# Patient Record
Sex: Male | Born: 1960 | Race: White | Hispanic: No | Marital: Married | Smoking: Former smoker
Health system: Southern US, Community
[De-identification: ages and names within clinical notes are randomized; demographics above are authoritative.]

## PROBLEM LIST (undated history)

## (undated) DIAGNOSIS — G8929 Other chronic pain: Secondary | ICD-10-CM

## (undated) DIAGNOSIS — M542 Cervicalgia: Secondary | ICD-10-CM

## (undated) DIAGNOSIS — M549 Dorsalgia, unspecified: Secondary | ICD-10-CM

## (undated) DIAGNOSIS — B1920 Unspecified viral hepatitis C without hepatic coma: Secondary | ICD-10-CM

## (undated) DIAGNOSIS — Z972 Presence of dental prosthetic device (complete) (partial): Secondary | ICD-10-CM

## (undated) DIAGNOSIS — F513 Sleepwalking [somnambulism]: Secondary | ICD-10-CM

## (undated) DIAGNOSIS — I4891 Unspecified atrial fibrillation: Secondary | ICD-10-CM

## (undated) DIAGNOSIS — F1111 Opioid abuse, in remission: Secondary | ICD-10-CM

## (undated) DIAGNOSIS — Z87442 Personal history of urinary calculi: Secondary | ICD-10-CM

## (undated) DIAGNOSIS — F1910 Other psychoactive substance abuse, uncomplicated: Secondary | ICD-10-CM

## (undated) DIAGNOSIS — N200 Calculus of kidney: Secondary | ICD-10-CM

## (undated) DIAGNOSIS — I10 Essential (primary) hypertension: Secondary | ICD-10-CM

## (undated) DIAGNOSIS — K219 Gastro-esophageal reflux disease without esophagitis: Secondary | ICD-10-CM

## (undated) DIAGNOSIS — J45909 Unspecified asthma, uncomplicated: Secondary | ICD-10-CM

## (undated) DIAGNOSIS — F1190 Opioid use, unspecified, uncomplicated: Secondary | ICD-10-CM

## (undated) DIAGNOSIS — Z973 Presence of spectacles and contact lenses: Secondary | ICD-10-CM

## (undated) DIAGNOSIS — C61 Malignant neoplasm of prostate: Secondary | ICD-10-CM

## (undated) HISTORY — DX: Presence of spectacles and contact lenses: Z97.3

## (undated) HISTORY — DX: Dorsalgia, unspecified: M54.9

## (undated) HISTORY — DX: Gastro-esophageal reflux disease without esophagitis: K21.9

## (undated) HISTORY — DX: Other chronic pain: G89.29

## (undated) HISTORY — DX: Presence of dental prosthetic device (complete) (partial): Z97.2

## (undated) HISTORY — DX: Cervicalgia: M54.2

## (undated) HISTORY — DX: Malignant neoplasm of prostate: C61

## (undated) HISTORY — PX: OTHER SURGICAL HISTORY: SHX169

## (undated) HISTORY — DX: Calculus of kidney: N20.0

---

## 1997-10-21 HISTORY — PX: FINGER SURGERY: SHX640

## 1998-09-28 HISTORY — PX: FINGER SURGERY: SHX640

## 2006-10-21 DIAGNOSIS — B182 Chronic viral hepatitis C: Secondary | ICD-10-CM

## 2006-10-21 HISTORY — DX: Chronic viral hepatitis C: B18.2

## 2008-11-09 ENCOUNTER — Ambulatory Visit: Payer: Self-pay | Admitting: Diagnostic Radiology

## 2008-11-09 ENCOUNTER — Emergency Department (HOSPITAL_BASED_OUTPATIENT_CLINIC_OR_DEPARTMENT_OTHER): Admission: EM | Admit: 2008-11-09 | Discharge: 2008-11-09 | Payer: Self-pay | Admitting: Emergency Medicine

## 2009-08-03 ENCOUNTER — Emergency Department (HOSPITAL_BASED_OUTPATIENT_CLINIC_OR_DEPARTMENT_OTHER): Admission: EM | Admit: 2009-08-03 | Discharge: 2009-08-03 | Payer: Self-pay | Admitting: Emergency Medicine

## 2009-08-03 ENCOUNTER — Ambulatory Visit: Payer: Self-pay | Admitting: Diagnostic Radiology

## 2009-10-29 ENCOUNTER — Ambulatory Visit: Payer: Self-pay | Admitting: Diagnostic Radiology

## 2009-10-29 ENCOUNTER — Emergency Department (HOSPITAL_BASED_OUTPATIENT_CLINIC_OR_DEPARTMENT_OTHER): Admission: EM | Admit: 2009-10-29 | Discharge: 2009-10-29 | Payer: Self-pay | Admitting: Emergency Medicine

## 2010-08-30 ENCOUNTER — Emergency Department (HOSPITAL_BASED_OUTPATIENT_CLINIC_OR_DEPARTMENT_OTHER)
Admission: EM | Admit: 2010-08-30 | Discharge: 2010-08-30 | Payer: Self-pay | Source: Home / Self Care | Admitting: Emergency Medicine

## 2010-08-30 ENCOUNTER — Ambulatory Visit: Payer: Self-pay | Admitting: Diagnostic Radiology

## 2011-01-06 LAB — BASIC METABOLIC PANEL
Calcium: 9 mg/dL (ref 8.4–10.5)
GFR calc Af Amer: >60 mL/min (ref >60)
GFR calc non Af Amer: >60 mL/min (ref >60)
Potassium: 4.2 mEq/L (ref 3.5–5.1)
Sodium: 145 mEq/L (ref 135–145)

## 2011-01-06 LAB — DIFFERENTIAL
Basophils Absolute: 0.6 10*3/uL — ABNORMAL HIGH (ref 0.0–0.1)
Lymphocytes Relative: 19 % (ref 12–46)
Lymphs Abs: 2.7 10*3/uL (ref 0.7–4.0)
Monocytes Absolute: 1.4 10*3/uL — ABNORMAL HIGH (ref 0.1–1.0)
Monocytes Relative: 10 % (ref 3–12)
Neutro Abs: 9.5 10*3/uL — ABNORMAL HIGH (ref 1.7–7.7)

## 2011-01-06 LAB — POCT CARDIAC MARKERS
CKMB, poc: 1 ng/mL (ref 1.0–8.0)
Myoglobin, poc: 63 ng/mL (ref 12–200)
Myoglobin, poc: 98.4 ng/mL (ref 12–200)
Troponin i, poc: <0.05 ng/mL (ref 0.00–0.09)

## 2011-01-06 LAB — CBC
HCT: 45.2 % (ref 39.0–52.0)
Hemoglobin: 15.7 g/dL (ref 13.0–17.0)
RBC: 5.06 MIL/uL (ref 4.22–5.81)
RDW: 12.6 % (ref 11.5–15.5)
WBC: 14.4 10*3/uL — ABNORMAL HIGH (ref 4.0–10.5)

## 2011-01-24 LAB — URINALYSIS, ROUTINE W REFLEX MICROSCOPIC
Bilirubin Urine: NEGATIVE
Glucose, UA: NEGATIVE mg/dL
Hgb urine dipstick: NEGATIVE
Protein, ur: NEGATIVE mg/dL

## 2012-10-01 DIAGNOSIS — M159 Polyosteoarthritis, unspecified: Secondary | ICD-10-CM

## 2012-10-01 DIAGNOSIS — N200 Calculus of kidney: Secondary | ICD-10-CM

## 2012-10-01 DIAGNOSIS — M5412 Radiculopathy, cervical region: Secondary | ICD-10-CM | POA: Insufficient documentation

## 2012-10-01 HISTORY — DX: Radiculopathy, cervical region: M54.12

## 2012-10-01 HISTORY — DX: Polyosteoarthritis, unspecified: M15.9

## 2012-10-02 DIAGNOSIS — M79609 Pain in unspecified limb: Secondary | ICD-10-CM | POA: Insufficient documentation

## 2012-11-02 ENCOUNTER — Encounter: Payer: Self-pay | Admitting: Internal Medicine

## 2012-11-02 ENCOUNTER — Ambulatory Visit (INDEPENDENT_AMBULATORY_CARE_PROVIDER_SITE_OTHER): Payer: BC Managed Care – PPO | Admitting: Internal Medicine

## 2012-11-02 VITALS — BP 130/90 | HR 80 | Temp 97.9°F | Ht 73.0 in | Wt 162.0 lb

## 2012-11-02 DIAGNOSIS — G8929 Other chronic pain: Secondary | ICD-10-CM

## 2012-11-02 DIAGNOSIS — M25521 Pain in right elbow: Secondary | ICD-10-CM | POA: Insufficient documentation

## 2012-11-02 DIAGNOSIS — M549 Dorsalgia, unspecified: Secondary | ICD-10-CM

## 2012-11-02 DIAGNOSIS — M25529 Pain in unspecified elbow: Secondary | ICD-10-CM

## 2012-11-02 DIAGNOSIS — M542 Cervicalgia: Secondary | ICD-10-CM

## 2012-11-02 NOTE — Assessment & Plan Note (Signed)
Patient likely has traumatic tricep tendinitis.  Refer to orthopedic physician for possible steroid injection.
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
## 2012-11-03 ENCOUNTER — Encounter: Payer: Self-pay | Admitting: Medical

## 2012-11-03 ENCOUNTER — Ambulatory Visit (INDEPENDENT_AMBULATORY_CARE_PROVIDER_SITE_OTHER): Payer: BC Managed Care – PPO | Admitting: Medical

## 2012-11-03 VITALS — BP 120/80 | HR 80 | Temp 98.1°F | Resp 16 | Ht 69.0 in | Wt 163.0 lb

## 2012-11-03 DIAGNOSIS — M549 Dorsalgia, unspecified: Secondary | ICD-10-CM

## 2012-11-03 DIAGNOSIS — F17200 Nicotine dependence, unspecified, uncomplicated: Secondary | ICD-10-CM

## 2012-11-03 DIAGNOSIS — M5412 Radiculopathy, cervical region: Secondary | ICD-10-CM

## 2012-11-03 DIAGNOSIS — G8929 Other chronic pain: Secondary | ICD-10-CM

## 2012-11-03 DIAGNOSIS — M542 Cervicalgia: Secondary | ICD-10-CM

## 2012-11-03 DIAGNOSIS — M5410 Radiculopathy, site unspecified: Secondary | ICD-10-CM

## 2012-11-03 DIAGNOSIS — M25519 Pain in unspecified shoulder: Secondary | ICD-10-CM

## 2012-11-03 DIAGNOSIS — M79609 Pain in unspecified limb: Secondary | ICD-10-CM

## 2012-11-03 MED ORDER — GABAPENTIN 100 MG PO CAPS: ORAL_CAPSULE | ORAL | Status: DC
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
## 2012-11-03 NOTE — Patient Instructions (Signed)
Begin neurontin 100mg  1 capsule daily for 3 days, then twice daily for 3 days, then 3 times daily.    We will request prior records.  We will then look at a referral once we have records.

## 2012-11-04 ENCOUNTER — Telehealth: Payer: Self-pay | Admitting: Internal Medicine

## 2012-11-04 NOTE — Telephone Encounter (Signed)
Dismissal Letter sent by Certified Mail 11/04/2012  Received the Return Receipt showing the patient has the Dismissal Letter 11/06/2012

## 2013-01-15 ENCOUNTER — Ambulatory Visit (INDEPENDENT_AMBULATORY_CARE_PROVIDER_SITE_OTHER): Payer: No Typology Code available for payment source | Admitting: Family Medicine

## 2013-01-15 ENCOUNTER — Telehealth: Payer: Self-pay

## 2013-01-15 VITALS — BP 124/76 | HR 103 | Temp 98.3°F | Resp 17 | Ht 72.0 in | Wt 156.0 lb

## 2013-01-15 DIAGNOSIS — K219 Gastro-esophageal reflux disease without esophagitis: Secondary | ICD-10-CM

## 2013-01-15 DIAGNOSIS — M531 Cervicobrachial syndrome: Secondary | ICD-10-CM

## 2013-01-15 DIAGNOSIS — G542 Cervical root disorders, not elsewhere classified: Secondary | ICD-10-CM

## 2013-01-15 MED ORDER — GABAPENTIN 300 MG PO CAPS: 300.0000 mg | ORAL_CAPSULE | Freq: Every day | ORAL | Status: DC

## 2013-01-15 MED ORDER — OMEPRAZOLE 20 MG PO CPDR: 20.0000 mg | DELAYED_RELEASE_CAPSULE | Freq: Every day | ORAL | Status: DC

## 2013-01-15 MED ORDER — OXYMORPHONE HCL ER 15 MG PO TB12: 15.0000 mg | ORAL_TABLET | Freq: Two times a day (BID) | ORAL | Status: DC

## 2013-01-15 MED ORDER — CARISOPRODOL 350 MG PO TABS: 350.0000 mg | ORAL_TABLET | Freq: Two times a day (BID) | ORAL | Status: DC
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
## 2013-01-15 NOTE — Telephone Encounter (Signed)
Gave pt message from Dr L that he would like pt to RTC next Thurs and gave him hours. Pt agreed and wanted to thank Dr L, stating that what he had done allowed him to work today and really helped.   Pt reported that the co-pay for Opana is $380 and he can not afford it. He requested to be put back on the short acting Oxycodone hydrochloride 15 mg that has helped him in the past and will only cost him #15. Dr L, so you want to write this for pt?

## 2013-01-15 NOTE — Patient Instructions (Signed)
I am referring you to a physical therapist.  I cannot refill the narcotics.  I will also refer you to a pain specialist.

## 2013-01-19 NOTE — Telephone Encounter (Signed)
Please forward to the correct pool.

## 2013-01-19 NOTE — Telephone Encounter (Signed)
I really cannot write for chronic short acting narcotic medications.

## 2013-01-19 NOTE — Telephone Encounter (Signed)
Dr L, this message is still in my open encounters. Have you reviewed/addressed this yet?

## 2013-01-20 NOTE — Telephone Encounter (Signed)
Patient advised.

## 2013-01-20 NOTE — Telephone Encounter (Signed)
Called patient to advise left message for him to call me back so I can advise.

## 2013-01-21 ENCOUNTER — Ambulatory Visit (INDEPENDENT_AMBULATORY_CARE_PROVIDER_SITE_OTHER): Payer: No Typology Code available for payment source | Admitting: Family Medicine

## 2013-01-21 VITALS — BP 116/69 | HR 74 | Temp 98.1°F | Resp 16 | Ht 73.0 in | Wt 161.6 lb

## 2013-01-21 DIAGNOSIS — G8929 Other chronic pain: Secondary | ICD-10-CM

## 2013-01-21 DIAGNOSIS — M542 Cervicalgia: Secondary | ICD-10-CM

## 2013-01-21 MED ORDER — OXYCODONE HCL 15 MG PO TABS: 15.0000 mg | ORAL_TABLET | Freq: Two times a day (BID) | ORAL | Status: DC
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
## 2013-01-28 ENCOUNTER — Encounter: Payer: Self-pay | Admitting: Physical Medicine & Rehabilitation

## 2013-02-16 ENCOUNTER — Ambulatory Visit: Payer: BC Managed Care – PPO | Admitting: Physical Medicine & Rehabilitation

## 2013-02-16 ENCOUNTER — Ambulatory Visit (HOSPITAL_BASED_OUTPATIENT_CLINIC_OR_DEPARTMENT_OTHER): Payer: No Typology Code available for payment source | Admitting: Physical Medicine & Rehabilitation

## 2013-02-16 ENCOUNTER — Encounter: Payer: No Typology Code available for payment source | Attending: Physical Medicine & Rehabilitation

## 2013-02-16 ENCOUNTER — Telehealth: Payer: Self-pay

## 2013-02-16 ENCOUNTER — Telehealth: Payer: Self-pay | Admitting: Radiology

## 2013-02-16 ENCOUNTER — Encounter: Payer: Self-pay | Admitting: Physical Medicine & Rehabilitation

## 2013-02-16 VITALS — BP 124/80 | HR 83 | Resp 14 | Ht 73.0 in | Wt 160.0 lb

## 2013-02-16 DIAGNOSIS — G8929 Other chronic pain: Secondary | ICD-10-CM

## 2013-02-16 DIAGNOSIS — M5412 Radiculopathy, cervical region: Secondary | ICD-10-CM | POA: Insufficient documentation

## 2013-02-16 DIAGNOSIS — M25521 Pain in right elbow: Secondary | ICD-10-CM

## 2013-02-16 DIAGNOSIS — M4802 Spinal stenosis, cervical region: Secondary | ICD-10-CM | POA: Insufficient documentation

## 2013-02-16 DIAGNOSIS — M549 Dorsalgia, unspecified: Secondary | ICD-10-CM | POA: Insufficient documentation

## 2013-02-16 DIAGNOSIS — M25529 Pain in unspecified elbow: Secondary | ICD-10-CM | POA: Insufficient documentation

## 2013-02-16 DIAGNOSIS — Z5181 Encounter for therapeutic drug level monitoring: Secondary | ICD-10-CM

## 2013-02-16 DIAGNOSIS — Z79899 Other long term (current) drug therapy: Secondary | ICD-10-CM

## 2013-02-16 HISTORY — DX: Radiculopathy, cervical region: M54.12

## 2013-02-16 MED ORDER — DIAZEPAM 10 MG PO TABS: 10.0000 mg | ORAL_TABLET | Freq: Once | ORAL | Status: DC

## 2013-02-16 NOTE — Telephone Encounter (Signed)
Dr Wynn Banker... Does pt need to get a MRI? If he does there is not an order.. I saw in the comments Check out comments: Need MRI C spine [facility]   I called [facility] and they stated pt has not had a MRI C SPINE.Christian KitchenMarland Mitchell

## 2013-02-16 NOTE — Telephone Encounter (Signed)
Patient states he's had an MRI. He thinks he was at the [facility]. Please contact referring physician's office to see if they have further info on this

## 2013-02-16 NOTE — Telephone Encounter (Signed)
He also requests [address]

## 2013-02-16 NOTE — Addendum Note (Signed)
Addended by: Judd Gaudier on: 02/16/2013 01:28 PM   Modules accepted: Orders

## 2013-02-16 NOTE — Telephone Encounter (Signed)
Left patient a message that he needs to come back to the office to give urine sample in order to get medications.
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
## 2013-02-16 NOTE — Telephone Encounter (Signed)
These are both controlled substances.  Patient needs OV for hand written script, which we can do for only one month.  These need to be written by a Pain Clinic doctor per federal guidelines.  I will put in a referral to a pain clinic

## 2013-02-16 NOTE — Telephone Encounter (Signed)
Patient requests opana Rx please advise.

## 2013-02-16 NOTE — Patient Instructions (Signed)
Cervical epidural injection-

## 2013-02-17 ENCOUNTER — Ambulatory Visit (INDEPENDENT_AMBULATORY_CARE_PROVIDER_SITE_OTHER): Payer: No Typology Code available for payment source | Admitting: Family Medicine

## 2013-02-17 VITALS — BP 122/75 | HR 87 | Temp 98.4°F | Resp 16 | Ht 73.0 in | Wt 158.0 lb

## 2013-02-17 DIAGNOSIS — M531 Cervicobrachial syndrome: Secondary | ICD-10-CM

## 2013-02-17 DIAGNOSIS — G542 Cervical root disorders, not elsewhere classified: Secondary | ICD-10-CM

## 2013-02-17 DIAGNOSIS — M542 Cervicalgia: Secondary | ICD-10-CM

## 2013-02-17 DIAGNOSIS — G8929 Other chronic pain: Secondary | ICD-10-CM

## 2013-02-17 MED ORDER — OXYCODONE HCL 15 MG PO TABS: 15.0000 mg | ORAL_TABLET | Freq: Two times a day (BID) | ORAL | Status: DC

## 2013-02-17 MED ORDER — CARISOPRODOL 350 MG PO TABS: 350.0000 mg | ORAL_TABLET | Freq: Two times a day (BID) | ORAL | Status: DC

## 2013-02-17 MED ORDER — OXYMORPHONE HCL ER 15 MG PO TB12: 15.0000 mg | ORAL_TABLET | Freq: Two times a day (BID) | ORAL | Status: DC
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
## 2013-03-16 ENCOUNTER — Ambulatory Visit (INDEPENDENT_AMBULATORY_CARE_PROVIDER_SITE_OTHER): Payer: No Typology Code available for payment source | Admitting: Family Medicine

## 2013-03-16 VITALS — BP 135/77 | HR 81 | Temp 98.0°F | Resp 18 | Ht 72.0 in | Wt 150.0 lb

## 2013-03-16 DIAGNOSIS — G8929 Other chronic pain: Secondary | ICD-10-CM

## 2013-03-16 DIAGNOSIS — M542 Cervicalgia: Secondary | ICD-10-CM

## 2013-03-16 DIAGNOSIS — M531 Cervicobrachial syndrome: Secondary | ICD-10-CM

## 2013-03-16 DIAGNOSIS — G542 Cervical root disorders, not elsewhere classified: Secondary | ICD-10-CM

## 2013-03-16 MED ORDER — OXYCODONE HCL 15 MG PO TABS: 15.0000 mg | ORAL_TABLET | Freq: Three times a day (TID) | ORAL | Status: DC | PRN
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
## 2013-04-15 ENCOUNTER — Other Ambulatory Visit: Payer: Self-pay | Admitting: Family Medicine

## 2013-04-15 ENCOUNTER — Telehealth: Payer: Self-pay

## 2013-04-15 DIAGNOSIS — M542 Cervicalgia: Secondary | ICD-10-CM

## 2013-04-15 NOTE — Telephone Encounter (Signed)
PT WOULD LIKE TO TALK WITH YOU ABOUT GETTING A REFERRAL TO [facility] - DR Murray Hodgkins PLEASE CALL PT TO DISCUSS

## 2013-04-16 ENCOUNTER — Telehealth: Payer: Self-pay

## 2013-04-16 NOTE — Telephone Encounter (Signed)
PATIENT WOULD LIKE TO KNOW IF HE CAN GET MORE OF HIS PAIN MEDICATION UNTIL HE CAN GET TO HIS REFERRAL APPOINTMENT PLEASE CALL PATIENT AT [phone]

## 2013-04-16 NOTE — Telephone Encounter (Signed)
Called him to advise.  

## 2013-04-16 NOTE — Telephone Encounter (Signed)
no

## 2013-05-03 ENCOUNTER — Encounter (HOSPITAL_COMMUNITY): Payer: Self-pay | Admitting: Emergency Medicine

## 2013-05-03 ENCOUNTER — Emergency Department (HOSPITAL_COMMUNITY)
Admission: EM | Admit: 2013-05-03 | Discharge: 2013-05-03 | Disposition: A | Discharge status: Home / Self Care | Payer: No Typology Code available for payment source | Attending: Emergency Medicine | Admitting: Emergency Medicine

## 2013-05-03 DIAGNOSIS — K219 Gastro-esophageal reflux disease without esophagitis: Secondary | ICD-10-CM | POA: Insufficient documentation

## 2013-05-03 DIAGNOSIS — Z87442 Personal history of urinary calculi: Secondary | ICD-10-CM | POA: Insufficient documentation

## 2013-05-03 DIAGNOSIS — R109 Unspecified abdominal pain: Secondary | ICD-10-CM

## 2013-05-03 DIAGNOSIS — I10 Essential (primary) hypertension: Secondary | ICD-10-CM | POA: Insufficient documentation

## 2013-05-03 DIAGNOSIS — R079 Chest pain, unspecified: Secondary | ICD-10-CM | POA: Insufficient documentation

## 2013-05-03 DIAGNOSIS — Z98811 Dental restoration status: Secondary | ICD-10-CM | POA: Insufficient documentation

## 2013-05-03 DIAGNOSIS — IMO0001 Reserved for inherently not codable concepts without codable children: Secondary | ICD-10-CM | POA: Insufficient documentation

## 2013-05-03 DIAGNOSIS — F17200 Nicotine dependence, unspecified, uncomplicated: Secondary | ICD-10-CM | POA: Insufficient documentation

## 2013-05-03 DIAGNOSIS — R0602 Shortness of breath: Secondary | ICD-10-CM | POA: Insufficient documentation

## 2013-05-03 DIAGNOSIS — T50905A Adverse effect of unspecified drugs, medicaments and biological substances, initial encounter: Secondary | ICD-10-CM

## 2013-05-03 DIAGNOSIS — R1084 Generalized abdominal pain: Secondary | ICD-10-CM | POA: Insufficient documentation

## 2013-05-03 DIAGNOSIS — Z789 Other specified health status: Secondary | ICD-10-CM | POA: Insufficient documentation

## 2013-05-03 DIAGNOSIS — G8929 Other chronic pain: Secondary | ICD-10-CM | POA: Insufficient documentation

## 2013-05-03 DIAGNOSIS — Z79899 Other long term (current) drug therapy: Secondary | ICD-10-CM | POA: Insufficient documentation

## 2013-05-03 DIAGNOSIS — R112 Nausea with vomiting, unspecified: Secondary | ICD-10-CM | POA: Insufficient documentation

## 2013-05-03 DIAGNOSIS — R197 Diarrhea, unspecified: Secondary | ICD-10-CM | POA: Insufficient documentation

## 2013-05-03 DIAGNOSIS — R6883 Chills (without fever): Secondary | ICD-10-CM | POA: Insufficient documentation

## 2013-05-03 LAB — URINALYSIS, ROUTINE W REFLEX MICROSCOPIC
Glucose, UA: NEGATIVE mg/dL
Hgb urine dipstick: NEGATIVE
Protein, ur: 100 mg/dL — AB
Specific Gravity, Urine: 1.037 — ABNORMAL HIGH (ref 1.005–1.030)
Urobilinogen, UA: 1 mg/dL (ref 0.0–1.0)

## 2013-05-03 LAB — CBC WITH DIFFERENTIAL/PLATELET
Basophils Absolute: 0 10*3/uL (ref 0.0–0.1)
Eosinophils Absolute: 0.2 10*3/uL (ref 0.0–0.7)
Lymphs Abs: 3.3 10*3/uL (ref 0.7–4.0)
MCH: 31.1 pg (ref 26.0–34.0)
MCHC: 36.2 g/dL — ABNORMAL HIGH (ref 30.0–36.0)
MCV: 86 fL (ref 78.0–100.0)
Monocytes Absolute: 2.1 10*3/uL — ABNORMAL HIGH (ref 0.1–1.0)
Neutro Abs: 10.9 10*3/uL — ABNORMAL HIGH (ref 1.7–7.7)
Platelets: 262 10*3/uL (ref 150–400)
RDW: 13.4 % (ref 11.5–15.5)
WBC: 16.5 10*3/uL — ABNORMAL HIGH (ref 4.0–10.5)

## 2013-05-03 LAB — LIPASE, BLOOD: Lipase: 56 U/L (ref 11–59)

## 2013-05-03 LAB — COMPREHENSIVE METABOLIC PANEL
AST: 29 U/L (ref 0–37)
Albumin: 5.7 g/dL — ABNORMAL HIGH (ref 3.5–5.2)
BUN: 14 mg/dL (ref 6–23)
Calcium: 12.3 mg/dL — ABNORMAL HIGH (ref 8.4–10.5)
Creatinine, Ser: 1.35 mg/dL (ref 0.50–1.35)

## 2013-05-03 LAB — RAPID URINE DRUG SCREEN, HOSP PERFORMED
Amphetamines: POSITIVE — AB
Cocaine: POSITIVE — AB
Opiates: POSITIVE — AB
Tetrahydrocannabinol: NOT DETECTED

## 2013-05-03 LAB — CK: Total CK: 102 U/L (ref 7–232)

## 2013-05-03 LAB — URINE MICROSCOPIC-ADD ON

## 2013-05-03 MED ORDER — ONDANSETRON HCL 4 MG/2ML IJ SOLN
4.0000 mg | Freq: Once | INTRAMUSCULAR | Status: AC
  Administered 2013-05-03: 4 mg via INTRAVENOUS
  Filled 2013-05-03: qty 2

## 2013-05-03 MED ORDER — OXYCODONE-ACETAMINOPHEN 5-325 MG PO TABS: 1.0000 | ORAL_TABLET | ORAL | Status: DC | PRN

## 2013-05-03 MED ORDER — ONDANSETRON HCL 4 MG PO TABS: 4.0000 mg | ORAL_TABLET | Freq: Three times a day (TID) | ORAL | Status: DC | PRN

## 2013-05-03 MED ORDER — MORPHINE SULFATE 4 MG/ML IJ SOLN
4.0000 mg | Freq: Once | INTRAMUSCULAR | Status: AC
  Administered 2013-05-03: 4 mg via INTRAVENOUS
  Filled 2013-05-03: qty 1

## 2013-05-03 MED ORDER — PROMETHAZINE HCL 25 MG/ML IJ SOLN
25.0000 mg | Freq: Once | INTRAMUSCULAR | Status: AC
  Administered 2013-05-03: 25 mg via INTRAVENOUS
  Filled 2013-05-03: qty 1

## 2013-05-03 MED ORDER — MORPHINE SULFATE 4 MG/ML IJ SOLN
6.0000 mg | Freq: Once | INTRAMUSCULAR | Status: AC
  Administered 2013-05-03: 6 mg via INTRAVENOUS
  Filled 2013-05-03: qty 2

## 2013-05-03 MED ORDER — PANTOPRAZOLE SODIUM 40 MG IV SOLR
40.0000 mg | Freq: Once | INTRAVENOUS | Status: AC
Start: 2013-05-03 — End: 2013-05-03
  Administered 2013-05-03: 40 mg via INTRAVENOUS
  Filled 2013-05-03: qty 40

## 2013-05-03 MED ORDER — SODIUM CHLORIDE 0.9 % IV BOLUS (SEPSIS)
1000.0000 mL | Freq: Once | INTRAVENOUS | Status: AC
  Administered 2013-05-03: 1000 mL via INTRAVENOUS

## 2013-05-03 MED ORDER — SODIUM CHLORIDE 0.9 % IV SOLN: Freq: Once | INTRAVENOUS | Status: DC

## 2013-05-03 MED ORDER — GI COCKTAIL ~~LOC~~
30.0000 mL | Freq: Once | ORAL | Status: AC
  Administered 2013-05-03: 30 mL via ORAL
  Filled 2013-05-03: qty 30

## 2013-05-03 NOTE — ED Provider Notes (Signed)
History    CSN: [id number] Arrival date & time 05/03/13  4098  First MD Initiated Contact with Patient 05/03/13 1012     Chief Complaint  Patient presents with  . Abdominal Pain   (Consider location/radiation/quality/duration/timing/severity/associated sxs/prior Treatment) HPI Comments: Patient reports he was drinking tequila and smoked a joint on a boat with friends four days ago and became sick, first vomiting off the side of the boat, then continuing to have N/V/D and abdominal pain the rest of the weekend.  States he is no longer able to keep any fluids down.  Does note bloody emesis twice, once overnight Friday and once Saturday night.  He has since not seen any blood in his emesis.  Diarrhea is described as dark.  States "I am dehydrated as hell" and "thirsty as hell."  States he has pain in his chest that is burning, worse with drinking.  Pain in his abdomen is also initiated by drinking or eating, described as "needles."  States he is so dehydrated his muscles are cramping and he has pain all over.  States everyone else he was with on the boat is also sick with similar symptoms.  Pt also is on chronic narcotics for chronic neck pain, has been weaning himself off of his narcotics because of problems with affording medications/insurance/supply (see [facility] recently).  Also notes he is SOB today.  Denies fevers, cough.   Patient is a 52 y.o. male presenting with abdominal pain. The history is provided by the patient.  Abdominal Pain Associated symptoms include abdominal pain, chest pain, chills, myalgias, nausea and vomiting. Pertinent negatives include no coughing or fever.   Past Medical History  Diagnosis Date  . GERD (gastroesophageal reflux disease)   . Chronic neck pain   . Chronic back pain   . Kidney stones   . Wears glasses   . Wears partial dentures    Past Surgical History  Procedure Laterality Date  . Finger surgery  1999    reattachement, left  1st - 4th  . Minor laceration repair     Family History  Problem Relation Age of Onset  . Arthritis Mother   . Diabetes Mother   . Cancer Father     prostate  . Arthritis Maternal Grandmother   . Heart disease Maternal Grandmother   . Arthritis Paternal Grandmother   . Stroke Paternal Grandmother   . Diabetes Paternal Grandmother   . Cancer Paternal Grandfather     colon  . Hyperlipidemia Paternal Grandfather   . Hypertension Paternal Grandfather    History  Substance Use Topics  . Smoking status: Current Every Day Smoker -- 1.00 packs/day for 25 years    Types: Cigarettes  . Smokeless tobacco: Not on file  . Alcohol Use: No    Review of Systems  Constitutional: Positive for chills. Negative for fever.  Respiratory: Positive for shortness of breath. Negative for cough.   Cardiovascular: Positive for chest pain.  Gastrointestinal: Positive for nausea, vomiting, abdominal pain and diarrhea.  Genitourinary: Positive for decreased urine volume. Negative for dysuria, urgency and frequency.  Musculoskeletal: Positive for myalgias.    Allergies  Review of patient's allergies indicates no known allergies.  Home Medications   Current Outpatient Rx  Name  Route  Sig  Dispense  Refill  . carisoprodol (SOMA) 350 MG tablet   Oral   Take 1 tablet (350 mg total) by mouth 2 (two) times daily.   60 tablet   2   .  diazepam (VALIUM) 10 MG tablet   Oral   Take 1 tablet (10 mg total) by mouth once.   2 tablet   1   . gabapentin (NEURONTIN) 300 MG capsule   Oral   Take 1 capsule (300 mg total) by mouth at bedtime.   90 capsule   3   . omeprazole (PRILOSEC) 20 MG capsule   Oral   Take 1 capsule (20 mg total) by mouth daily.   30 capsule   6   . oxyCODONE (ROXICODONE) 15 MG immediate release tablet   Oral   Take 1 tablet (15 mg total) by mouth every 8 (eight) hours as needed for pain.   90 tablet   0   . Tamsulosin HCl (FLOMAX PO)   Oral   Take by mouth.           BP 160/142  Pulse 118  Temp(Src) 98.7 F (37.1 C) (Oral)  Resp 16  Ht 6\' 1"  (1.854 m)  Wt 145 lb (65.772 kg)  BMI 19.13 kg/m2  SpO2 95% Physical Exam  Nursing note and vitals reviewed. Constitutional: He appears well-developed and well-nourished. No distress.  HENT:  Head: Normocephalic and atraumatic.  Neck: Neck supple.  Cardiovascular: Normal rate and regular rhythm.   Pulmonary/Chest: Effort normal and breath sounds normal. No respiratory distress. He has no wheezes. He has no rales.  Abdominal: Soft. He exhibits no distension and no mass. There is generalized tenderness. There is no rebound and no guarding.  Musculoskeletal: He exhibits no edema.  Neurological: He is alert. He exhibits normal muscle tone.  Skin: He is not diaphoretic.  Psychiatric: His affect is angry. He is agitated.    ED Course  Procedures (including critical care time) Labs Reviewed  CBC WITH DIFFERENTIAL - Abnormal; Notable for the following:    WBC 16.5 (*)    RBC 6.30 (*)    Hemoglobin 19.6 (*)    HCT 54.2 (*)    MCHC 36.2 (*)    Monocytes Relative 13 (*)    Neutro Abs 10.9 (*)    Monocytes Absolute 2.1 (*)    All other components within normal limits  COMPREHENSIVE METABOLIC PANEL - Abnormal; Notable for the following:    Chloride 95 (*)    CO2 33 (*)    Glucose, Bld 113 (*)    Calcium 12.3 (*)    Total Protein 9.4 (*)    Albumin 5.7 (*)    Total Bilirubin 2.1 (*)    GFR calc non Af Amer 60 (*)    GFR calc Af Amer 69 (*)    All other components within normal limits  URINE RAPID DRUG SCREEN (HOSP PERFORMED) - Abnormal; Notable for the following:    Opiates POSITIVE (*)    Cocaine POSITIVE (*)    Benzodiazepines POSITIVE (*)    Amphetamines POSITIVE (*)    All other components within normal limits  URINALYSIS, ROUTINE W REFLEX MICROSCOPIC - Abnormal; Notable for the following:    Color, Urine ORANGE (*)    APPearance CLOUDY (*)    Specific Gravity, Urine 1.037 (*)    Bilirubin  Urine SMALL (*)    Ketones, ur 40 (*)    Protein, ur 100 (*)    Nitrite POSITIVE (*)    Leukocytes, UA SMALL (*)    All other components within normal limits  URINE MICROSCOPIC-ADD ON - Abnormal; Notable for the following:    Casts HYALINE CASTS (*)    All other components  within normal limits  LIPASE, BLOOD  CK   No results found.  Reviewed labs with Dr Juleen China.   2:49 PM Pt feeling much better after 2L IVF.  Pt looks much improved.    Filed Vitals:   05/03/13 1505  BP: 105/64  Pulse: 75  Temp: 97.8 F (36.6 C)  Resp: 18     1. Abdominal pain   2. Nausea vomiting and diarrhea   3. Adverse reaction to drug, initial encounter     MDM  Pt with abdominal pain, N/V/D, and body aches for several days following smoking unknown substance.  All others with him that developed same symptoms.  Labs show dehydration. Pt given IVF, symptomatic treatment in ED.  Feeling much better after treatment. Discussed all results with patient.  Pt given return precautions.  Pt verbalizes understanding and agrees with plan.      Trixie Dredge, PA-C 05/03/13 1902

## 2013-05-03 NOTE — ED Notes (Signed)
Per pt, abdominal pain, N/V/D since Fri-unable to tolerate PO's

## 2013-05-03 NOTE — Discharge Instructions (Signed)
Read the information below.  Use the prescribed medication as directed.  Please discuss all new medications with your pharmacist.  Do not take additional tylenol while taking the prescribed pain medication to avoid overdose.  Your drug screen was positive for cocaine, amphetamines, benzodiazepines, and narcotics. Drink plenty of fluids over the next few days. You may return to the Emergency Department at any time for worsening condition or any new symptoms that concern you.  If you develop high fevers, worsening abdominal pain, uncontrolled vomiting, or are unable to tolerate fluids by mouth, return to the ER for a recheck.    Abdominal Pain Abdominal pain can be caused by many things. Your caregiver decides the seriousness of your pain by an examination and possibly blood tests and X-rays. Many cases can be observed and treated at home. Most abdominal pain is not caused by a disease and will probably improve without treatment. However, in many cases, more time must pass before a clear cause of the pain can be found. Before that point, it may not be known if you need more testing, or if hospitalization or surgery is needed. HOME CARE INSTRUCTIONS   Do not take laxatives unless directed by your caregiver.  Take pain medicine only as directed by your caregiver.  Only take over-the-counter or prescription medicines for pain, discomfort, or fever as directed by your caregiver.  Try a clear liquid diet (broth, tea, or water) for as long as directed by your caregiver. Slowly move to a bland diet as tolerated. SEEK IMMEDIATE MEDICAL CARE IF:   The pain does not go away.  You have a fever.  You keep throwing up (vomiting).  The pain is felt only in portions of the abdomen. Pain in the right side could possibly be appendicitis. In an adult, pain in the left lower portion of the abdomen could be colitis or diverticulitis.  You pass bloody or black tarry stools. MAKE SURE YOU:   Understand these  instructions.  Will watch your condition.  Will get help right away if you are not doing well or get worse. Document Released: 07/17/2005 Document Revised: 12/30/2011 Document Reviewed: 05/25/2008 [facility] Patient Information 2014 [place], [place].  Nausea and Vomiting Nausea means you feel sick to your stomach. Throwing up (vomiting) is a reflex where stomach contents come out of your mouth. HOME CARE   Take medicine as told by your doctor.  Do not force yourself to eat. However, you do need to drink fluids.  If you feel like eating, eat a normal diet as told by your doctor.  Eat rice, wheat, potatoes, bread, lean meats, yogurt, fruits, and vegetables.  Avoid high-fat foods.  Drink enough fluids to keep your pee (urine) clear or pale yellow.  Ask your doctor how to replace body fluid losses (rehydrate). Signs of body fluid loss (dehydration) include:  Feeling very thirsty.  Dry lips and mouth.  Feeling dizzy.  Dark pee.  Peeing less than normal.  Feeling confused.  Fast breathing or heart rate. GET HELP RIGHT AWAY IF:   You have blood in your throw up.  You have black or bloody poop (stool).  You have a bad headache or stiff neck.  You feel confused.  You have bad belly (abdominal) pain.  You have chest pain or trouble breathing.  You do not pee at least once every 8 hours.  You have cold, clammy skin.  You keep throwing up after 24 to 48 hours.  You have a fever. MAKE SURE  YOU:   Understand these instructions.  Will watch your condition.  Will get help right away if you are not doing well or get worse. Document Released: 03/25/2008 Document Revised: 12/30/2011 Document Reviewed: 03/08/2011 [facility] Patient Information 2014 [place], [place].   RESOURCE GUIDE  Chronic Pain Problems: Contact [facility]  [phone] Patients need to be referred by their primary care doctor.  Insufficient Money for Medicine: Contact [employer]  Way:  call "211."   No Primary Care Doctor: - Call Health Connect  [phone] - can help you locate a primary care doctor that  accepts your insurance, provides certain services, etc. - Physician Referral Service- [phone]  Agencies that provide inexpensive medical care: - [facility]  [phone] - [facility]  [phone] - [facility]  [phone] - Women's Clinic  [phone] - Planned Parenthood  [phone] [facility]  [phone]  Medicaid-accepting [facility] Providers: - [facility]- [address]  [phone], Mon-Fri 9am-7pm, Sat 9am-1pm - [facility]- [address] [place], [address] [place]  [phone] - [facility]- [address] [place]  [phone] [facility]- [address]  [phone] - Renaye Rakers- [address] [place], [address], [phone]  Only accepts [place] patients after they have their name  applied to their card  Self Pay (no insurance) in [place]: - Sickle Cell Patients - [facility]  [address] [place], [phone] - [facility]- [address] [place]  [phone]       [facility]- [address]       -     [facility]- see information above (Speak to [employer] if you do not have insurance)       -  [facility]- [address],  [phone]       -  [facility]- [address] [phone]       -  Dr Julio Sicks-  [address], [place], [phone]       -  [facility] - [address] [phone]       -  [facility]- [address], [phone], also [address], [phone]       -     [facility]- [address] [place], [phone], 1st & 3rd Saturday         every month, 10am-1pm  -     Community [facility] and [facility]   [address] [place], [place].   Phone:  [phone], Fax:  [phone]. Hours of Operation:  9 am - 6 pm, M-F.  -     [facility]   [address], [place]   Phone: [phone], Fax: [phone]. Hours of Operation:  8:30 am - 5:30 pm, M-F.  [facility] [address] [place], [place] [address] [phone]  [facility] [address] [place], [place] [address] [phone]  1) Find a Doctor and Pay Out of Pocket Although you won't have to find out who is covered by your insurance plan, it is a good idea to ask around and get recommendations. You will then need to call the office and see  if the doctor you have chosen will accept you as a new patient and what types of options they offer for patients who are self-pay. Some doctors offer discounts or will set up payment plans for their patients who do not have insurance, but you will need to ask so you aren't surprised when you get to your appointment.  2) Contact Your Local Health Department Not all health departments have doctors that can see patients for sick visits, but many do, so it is worth a call to see if yours does. If you don't know where your local health department is, you can check in your phone book. The CDC also has a tool to help you locate your state's health department, and many state websites also have listings of all of their local health departments.  3) Find a Walk-in Clinic If your illness is not likely to be very severe or complicated, you may want to try a walk in clinic. These are popping up all over the country in pharmacies, drugstores, and shopping centers. They're usually staffed by nurse practitioners or physician assistants that have been trained to treat common illnesses and complaints. They're usually fairly quick and inexpensive. However, if you have serious medical issues or chronic medical problems, these are probably not your best option  STD  Testing - [facility] Department of [facility] [place], STD Clinic, [address], [place], phone [phone] or [phone].  Monday - Friday, call for an appointment. [facility] Department of [employer], STD Clinic, [address], [place], phone [phone] or [phone].  Monday - Friday, call for an appointment.  Abuse/Neglect: [facility] Child Abuse Hotline 8507150171 [facility] Child Abuse Hotline ([phone] (After Hours)  Emergency Shelter:  [facility] [phone]  Maternity Homes: - Room at the [place] [facility] [phone] - [facility] [phone]  MRSA Hotline #:   930 865 3953  Dental Assistance If unable to pay or uninsured, contact:  [facility] to become qualified for the adult dental clinic.  Patients with Medicaid: [facility] [phone] [address], [phone] [address] [phone]  If unable to pay, or uninsured, contact [facility] [phone] in [place], [phone] in [facility]) to become qualified for the adult dental clinic  [facility] [address] [place], [place] [address] [phone] [email or web address]  Other Proofreader Services: - [facility]- [address] [place], [place], [place], [address], [phone], Ext. 123, 2nd and 4th Thursday of the month at 6:30am.  10 clients each day by appointment, can sometimes see walk-in patients if someone does not show for an appointment. [facility]- [address] [place], [place], [address], [phone] - [facility] [address] [place], [place], [address], [phone] - [place] [facility]- [phone] [facility] Health Department- [phone] [facility] Health [phone]       Behavioral Health Resources in the [facility]  Intensive Outpatient  Programs: [facility]      [address] [place], [place] [phone] Both a day and evening program       [facility] Outpatient     [address]        [place], [place] [address] [phone]         ADS: [facility] [address] [place] [phone]  [facility] ACCESS LINE: 3406144725 or [phone] [address] [place], [place] [address] [email or web address]   Substance Abuse Resources: - [facility] Drug [facility]  [phone] - [facility] [phone] - [facility] [place] [phone] Floydene Flock [phone] - Residential & Outpatient Substance Abuse Program  [phone]  Psychological Services: [facility]  ([phone] [facility]  [phone] - [facility], [phone] [place]. [address] [place], ACCESS LINE: 949-345-4484 or [phone], [email or web address]  Mobile Crisis Teams:                                        [facility]atives         Mobile Crisis Care Unit [phone]             [facility] [address] Ginette Otto [phone]                                         Interventionist [address] [place] [phone]  Self-Help/Support Groups: Mental Health Assoc. of [employer] of support groups 812-217-9395 (call for more info)  Narcotics Anonymous (NA) [facility] [address] [place] - 2 meetings at this location  Residential Treatment Programs:  [facility]      [address]        [place]       [phone]         [facility] [address], [place] [address] [place], [place]  [address] [phone]  [facility]  [address] [place], [place] [address] [phone] Admissions: 8am-3pm  M-F  [facility]     [address]        [place], [place] [address]       [phone]         [facility] [address] [place], [place] [phone]  [facility] [address] [place], [place] [phone]  [facility] - Intensive Outpatient      [address]     [place], [place]       [phone]         [facility] ([facility]     [address] [place], [place] [phone] or [phone]  Residential Treatment Services (RTS), Medicaid [address] [place], [place] [phone]  [facility] [address]                                               [address] [place] [phone]  [facility] Resources: [phone]               General Therapy                                                Angie Fava, PhD        [phone]  [place], [place] [address]         [phone]   Insurance  [facility] Behavioral   [address] [place], [place] [address] [phone]  [facility] Recovery [address] [place], [place] [address] [phone] Insurance/Medicaid/sponsorship through [facility] Families                                              [address]                                        [place], [place] [address]    Therapy/tele-psych/case         [phone]          [facility] [address] [place], [place]  [address]  Adolescent/group home/case management [phone]                                           Creola Corn PhD       General therapy       Insurance   [phone]         Dr. Lolly Mustache, Insurance, M-F [phone]  [facility] [place]  [employer] [facility] Dept. [address]                 [address]         [address] [place] [address]  Blondell Reveal Phone:  [phone]                                  Phone:  [phone]                   Phone:  [phone]  [facility], [phone] - [facility] - [facility]- [phone]       -     [facility] in [place], [address]             [phone], Insurance  [place] Child Abuse Hotline [phone] or 4370781642 (After Hours)

## 2013-05-05 NOTE — ED Provider Notes (Signed)
Medical screening examination/treatment/procedure(s) were performed by non-physician practitioner and as supervising physician I was immediately available for consultation/collaboration.  Buna Cuppett, MD 05/05/13 1455 

## 2013-05-11 ENCOUNTER — Ambulatory Visit (INDEPENDENT_AMBULATORY_CARE_PROVIDER_SITE_OTHER): Payer: No Typology Code available for payment source | Admitting: Emergency Medicine

## 2013-05-11 VITALS — BP 118/80 | HR 90 | Temp 98.3°F | Resp 16 | Ht 71.0 in | Wt 155.0 lb

## 2013-05-11 DIAGNOSIS — G542 Cervical root disorders, not elsewhere classified: Secondary | ICD-10-CM

## 2013-05-11 DIAGNOSIS — M542 Cervicalgia: Secondary | ICD-10-CM

## 2013-05-11 DIAGNOSIS — M488X2 Other specified spondylopathies, cervical region: Secondary | ICD-10-CM

## 2013-05-11 DIAGNOSIS — G8929 Other chronic pain: Secondary | ICD-10-CM

## 2013-05-11 DIAGNOSIS — M5410 Radiculopathy, site unspecified: Secondary | ICD-10-CM

## 2013-05-11 MED ORDER — OXYCODONE HCL 15 MG PO TABS: 15.0000 mg | ORAL_TABLET | Freq: Three times a day (TID) | ORAL | Status: DC | PRN

## 2013-05-11 MED ORDER — CYCLOBENZAPRINE HCL 10 MG PO TABS: 10.0000 mg | ORAL_TABLET | Freq: Three times a day (TID) | ORAL | Status: DC | PRN

## 2013-05-11 NOTE — Patient Instructions (Addendum)
Cervical Radiculopathy  Cervical radiculopathy happens when a nerve in the neck is pinched or bruised by a slipped (herniated) disk or by arthritic changes in the bones of the cervical spine. This can occur due to an injury or as part of the normal aging process. Pressure on the cervical nerves can cause pain or numbness that runs from your neck all the way down into your arm and fingers.  CAUSES   There are many possible causes, including:   Injury.   Muscle tightness in the neck from overuse.   Swollen, painful joints (arthritis).   Breakdown or degeneration in the bones and joints of the spine (spondylosis) due to aging.   Bone spurs that may develop near the cervical nerves.  SYMPTOMS   Symptoms include pain, weakness, or numbness in the affected arm and hand. Pain can be severe or irritating. Symptoms may be worse when extending or turning the neck.  DIAGNOSIS   Your caregiver will ask about your symptoms and do a physical exam. He or she may test your strength and reflexes. X-rays, CT scans, and MRI scans may be needed in cases of injury or if the symptoms do not go away after a period of time. Electromyography (EMG) or nerve conduction testing may be done to study how your nerves and muscles are working.  TREATMENT   Your caregiver may recommend certain exercises to help relieve your symptoms. Cervical radiculopathy can, and often does, get better with time and treatment. If your problems continue, treatment options may include:   Wearing a soft collar for short periods of time.   Physical therapy to strengthen the neck muscles.   Medicines, such as nonsteroidal anti-inflammatory drugs (NSAIDs), oral corticosteroids, or spinal injections.   Surgery. Different types of surgery may be done depending on the cause of your problems.  HOME CARE INSTRUCTIONS    Put ice on the affected area.   Put ice in a plastic bag.   Place a towel between your skin and the bag.    Leave the ice on for 15-20 minutes, 3-4 times a day or as directed by your caregiver.   If ice does not help, you can try using heat. Take a warm shower or bath, or use a hot water bottle as directed by your caregiver.   You may try a gentle neck and shoulder massage.   Use a flat pillow when you sleep.   Only take over-the-counter or prescription medicines for pain, discomfort, or fever as directed by your caregiver.   If physical therapy was prescribed, follow your caregiver's directions.   If a soft collar was prescribed, use it as directed.  SEEK IMMEDIATE MEDICAL CARE IF:    Your pain gets much worse and cannot be controlled with medicines.   You have weakness or numbness in your hand, arm, face, or leg.   You have a high fever or a stiff, rigid neck.   You lose bowel or bladder control (incontinence).   You have trouble with walking, balance, or speaking.  MAKE SURE YOU:    Understand these instructions.   Will watch your condition.   Will get help right away if you are not doing well or get worse.  Document Released: 07/02/2001 Document Revised: 12/30/2011 Document Reviewed: 05/21/2011  [employer] Patient Information 2014 [employer].

## 2013-05-11 NOTE — Progress Notes (Signed)
[facility] [address], [place] [address] [phone]  Date:  05/11/2013   Name:  Christian Mitchell   DOB:  1961/10/13   MRN:  [id number]  PCP:  Elvina Sidle, MD    Chief Complaint: Medication Refill   History of Present Illness:  Christian Mitchell is a 52 y.o. very pleasant male patient who presents with the following:  Long history of neck pain that radiates into the left arm in a C6-7 distribution.  Is in the process of a referral to neurosurgery but has run out of medication in the meantime.  No improvement with over the counter medications or other home remedies. Denies other complaint or health concern today. Old medical records reviewed in detail.  Patient Active Problem List   Diagnosis Date Noted  . Cervical radiculitis 02/16/2013  . Chronic neck pain 11/02/2012  . Right elbow pain 11/02/2012    Past Medical History  Diagnosis Date  . GERD (gastroesophageal reflux disease)   . Chronic neck pain   . Chronic back pain   . Kidney stones   . Wears glasses   . Wears partial dentures     Past Surgical History  Procedure Laterality Date  . Finger surgery  1999    reattachement, left 1st - 4th  . Minor laceration repair      History  Substance Use Topics  . Smoking status: Current Every Day Smoker -- 1.00 packs/day for 25 years    Types: Cigarettes  . Smokeless tobacco: Not on file  . Alcohol Use: No    Family History  Problem Relation Age of Onset  . Arthritis Mother   . Diabetes Mother   . Cancer Father     prostate  . Arthritis Maternal Grandmother   . Heart disease Maternal Grandmother   . Arthritis Paternal Grandmother   . Stroke Paternal Grandmother   . Diabetes Paternal Grandmother   . Cancer Paternal Grandfather     colon  . Hyperlipidemia Paternal Grandfather   . Hypertension Paternal Grandfather     No Known Allergies  Medication list has been reviewed and updated.  Current Outpatient Prescriptions on  File Prior to Visit  Medication Sig Dispense Refill  . carisoprodol (SOMA) 350 MG tablet Take 1 tablet (350 mg total) by mouth 2 (two) times daily.  60 tablet  2  . gabapentin (NEURONTIN) 300 MG capsule Take 1 capsule (300 mg total) by mouth at bedtime.  90 capsule  3  . omeprazole (PRILOSEC) 20 MG capsule Take 1 capsule (20 mg total) by mouth daily.  30 capsule  6  . ondansetron (ZOFRAN) 4 MG tablet Take 1 tablet (4 mg total) by mouth every 8 (eight) hours as needed for nausea.  15 tablet  0  . oxyCODONE (ROXICODONE) 15 MG immediate release tablet Take 1 tablet (15 mg total) by mouth every 8 (eight) hours as needed for pain.  90 tablet  0  . Tamsulosin HCl (FLOMAX PO) Take by mouth.       No current facility-administered medications on file prior to visit.    Review of Systems:  As per HPI, otherwise negative.    Physical Examination: Filed Vitals:   05/11/13 1815  BP: 118/80  Pulse: 90  Temp: 98.3 F (36.8 C)  Resp: 16   Filed Vitals:   05/11/13 1815  Height: 5\' 11"  (1.803 m)  Weight: 155 lb (70.308 kg)   Body mass index is 21.63 kg/(m^2). Ideal Body Weight: Weight  in (lb) to have BMI = 25: 178.9  GEN: WDWN, NAD, Non-toxic, A & O x 3 HEENT: Atraumatic, Normocephalic. Neck supple. No masses, No LAD. Ears and Nose: No external deformity. CV: RRR, No M/G/R. No JVD. No thrill. No extra heart sounds. PULM: CTA B, no wheezes, crackles, rhonchi. No retractions. No resp. distress. No accessory muscle use. ABD: S, NT, ND, +BS. No rebound. No HSM. EXTR: No c/c/e NEURO Normal gait. Motor intact PSYCH: Normally interactive. Conversant. Not depressed or anxious appearing.  Calm demeanor.    Assessment and Plan: Cervical radiculopathy Refilled oxycodone ONCE.  No further refills Flexeril Follow up with neurosurgery   Signed,  Phillips Odor, MD

## 2013-06-02 DIAGNOSIS — F5104 Psychophysiologic insomnia: Secondary | ICD-10-CM | POA: Insufficient documentation

## 2013-06-02 DIAGNOSIS — R12 Heartburn: Secondary | ICD-10-CM | POA: Insufficient documentation

## 2013-06-02 DIAGNOSIS — G4700 Insomnia, unspecified: Secondary | ICD-10-CM | POA: Insufficient documentation

## 2013-06-02 HISTORY — DX: Psychophysiologic insomnia: F51.04

## 2013-06-03 DIAGNOSIS — G894 Chronic pain syndrome: Secondary | ICD-10-CM | POA: Insufficient documentation

## 2013-06-03 HISTORY — DX: Chronic pain syndrome: G89.4

## 2013-08-26 ENCOUNTER — Other Ambulatory Visit: Payer: Self-pay

## 2014-03-17 ENCOUNTER — Encounter (HOSPITAL_BASED_OUTPATIENT_CLINIC_OR_DEPARTMENT_OTHER): Payer: Self-pay | Admitting: Emergency Medicine

## 2014-03-17 ENCOUNTER — Emergency Department (HOSPITAL_BASED_OUTPATIENT_CLINIC_OR_DEPARTMENT_OTHER)
Admission: EM | Admit: 2014-03-17 | Discharge: 2014-03-17 | Disposition: A | Discharge status: Home / Self Care | Payer: No Typology Code available for payment source | Attending: Emergency Medicine | Admitting: Emergency Medicine

## 2014-03-17 ENCOUNTER — Emergency Department (HOSPITAL_BASED_OUTPATIENT_CLINIC_OR_DEPARTMENT_OTHER): Payer: No Typology Code available for payment source

## 2014-03-17 DIAGNOSIS — Y9389 Activity, other specified: Secondary | ICD-10-CM | POA: Insufficient documentation

## 2014-03-17 DIAGNOSIS — K219 Gastro-esophageal reflux disease without esophagitis: Secondary | ICD-10-CM | POA: Insufficient documentation

## 2014-03-17 DIAGNOSIS — M25539 Pain in unspecified wrist: Secondary | ICD-10-CM

## 2014-03-17 DIAGNOSIS — G8929 Other chronic pain: Secondary | ICD-10-CM | POA: Insufficient documentation

## 2014-03-17 DIAGNOSIS — Z87442 Personal history of urinary calculi: Secondary | ICD-10-CM | POA: Insufficient documentation

## 2014-03-17 DIAGNOSIS — Z791 Long term (current) use of non-steroidal anti-inflammatories (NSAID): Secondary | ICD-10-CM | POA: Insufficient documentation

## 2014-03-17 DIAGNOSIS — Y92410 Unspecified street and highway as the place of occurrence of the external cause: Secondary | ICD-10-CM | POA: Insufficient documentation

## 2014-03-17 DIAGNOSIS — Z79899 Other long term (current) drug therapy: Secondary | ICD-10-CM | POA: Insufficient documentation

## 2014-03-17 DIAGNOSIS — Z87891 Personal history of nicotine dependence: Secondary | ICD-10-CM | POA: Insufficient documentation

## 2014-03-17 DIAGNOSIS — S60219A Contusion of unspecified wrist, initial encounter: Secondary | ICD-10-CM | POA: Insufficient documentation

## 2014-03-17 MED ORDER — MELOXICAM 7.5 MG PO TABS: 7.5000 mg | ORAL_TABLET | Freq: Every day | ORAL | Status: DC

## 2014-03-17 MED ORDER — IBUPROFEN 800 MG PO TABS
800.0000 mg | ORAL_TABLET | Freq: Once | ORAL | Status: DC
  Filled 2014-03-17: qty 1
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
## 2014-03-17 NOTE — Discharge Instructions (Signed)
Cryotherapy Cryotherapy means treatment with cold. Ice or gel packs can be used to reduce both pain and swelling. Ice is the most helpful within the first 24 to 48 hours after an injury or flareup from overusing a muscle or joint. Sprains, strains, spasms, burning pain, shooting pain, and aches can all be eased with ice. Ice can also be used when recovering from surgery. Ice is effective, has very few side effects, and is safe for most people to use. PRECAUTIONS  Ice is not a safe treatment option for people with:  Raynaud's phenomenon. This is a condition affecting small blood vessels in the extremities. Exposure to cold may cause your problems to return.  Cold hypersensitivity. There are many forms of cold hypersensitivity, including:  Cold urticaria. Red, itchy hives appear on the skin when the tissues begin to warm after being iced.  Cold erythema. This is a red, itchy rash caused by exposure to cold.  Cold hemoglobinuria. Red blood cells break down when the tissues begin to warm after being iced. The hemoglobin that carry oxygen are passed into the urine because they cannot combine with blood proteins fast enough.  Numbness or altered sensitivity in the area being iced. If you have any of the following conditions, do not use ice until you have discussed cryotherapy with your caregiver:  Heart conditions, such as arrhythmia, angina, or chronic heart disease.  High blood pressure.  Healing wounds or open skin in the area being iced.  Current infections.  Rheumatoid arthritis.  Poor circulation.  Diabetes. Ice slows the blood flow in the region it is applied. This is beneficial when trying to stop inflamed tissues from spreading irritating chemicals to surrounding tissues. However, if you expose your skin to cold temperatures for too long or without the proper protection, you can damage your skin or nerves. Watch for signs of skin damage due to cold. HOME CARE INSTRUCTIONS Follow  these tips to use ice and cold packs safely.  Place a dry or damp towel between the ice and skin. A damp towel will cool the skin more quickly, so you may need to shorten the time that the ice is used.  For a more rapid response, add gentle compression to the ice.  Ice for no more than 10 to 20 minutes at a time. The bonier the area you are icing, the less time it will take to get the benefits of ice.  Check your skin after 5 minutes to make sure there are no signs of a poor response to cold or skin damage.  Rest 20 minutes or more in between uses.  Once your skin is numb, you can end your treatment. You can test numbness by very lightly touching your skin. The touch should be so light that you do not see the skin dimple from the pressure of your fingertip. When using ice, most people will feel these normal sensations in this order: cold, burning, aching, and numbness.  Do not use ice on someone who cannot communicate their responses to pain, such as small children or people with dementia. HOW TO MAKE AN ICE PACK Ice packs are the most common way to use ice therapy. Other methods include ice massage, ice baths, and cryo-sprays. Muscle creams that cause a cold, tingly feeling do not offer the same benefits that ice offers and should not be used as a substitute unless recommended by your caregiver. To make an ice pack, do one of the following:  Place crushed ice or   a bag of frozen vegetables in a sealable plastic bag. Squeeze out the excess air. Place this bag inside another plastic bag. Slide the bag into a pillowcase or place a damp towel between your skin and the bag.  Mix 3 parts water with 1 part rubbing alcohol. Freeze the mixture in a sealable plastic bag. When you remove the mixture from the freezer, it will be slushy. Squeeze out the excess air. Place this bag inside another plastic bag. Slide the bag into a pillowcase or place a damp towel between your skin and the bag. SEEK MEDICAL  CARE IF:  You develop white spots on your skin. This may give the skin a blotchy (mottled) appearance.  Your skin turns blue or pale.  Your skin becomes waxy or hard.  Your swelling gets worse. MAKE SURE YOU:   Understand these instructions.  Will watch your condition.  Will get help right away if you are not doing well or get worse. Document Released: 06/03/2011 Document Revised: 12/30/2011 Document Reviewed: 06/03/2011 [employer] Patient Information 2014 [employer].  
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
## 2014-03-18 ENCOUNTER — Encounter (HOSPITAL_BASED_OUTPATIENT_CLINIC_OR_DEPARTMENT_OTHER): Payer: Self-pay | Admitting: Emergency Medicine

## 2014-05-21 ENCOUNTER — Emergency Department (HOSPITAL_BASED_OUTPATIENT_CLINIC_OR_DEPARTMENT_OTHER): Payer: No Typology Code available for payment source

## 2014-05-21 ENCOUNTER — Encounter (HOSPITAL_BASED_OUTPATIENT_CLINIC_OR_DEPARTMENT_OTHER): Payer: Self-pay | Admitting: Emergency Medicine

## 2014-05-21 ENCOUNTER — Emergency Department (HOSPITAL_BASED_OUTPATIENT_CLINIC_OR_DEPARTMENT_OTHER)
Admission: EM | Admit: 2014-05-21 | Discharge: 2014-05-21 | Disposition: A | Discharge status: Home / Self Care | Payer: No Typology Code available for payment source | Attending: Emergency Medicine | Admitting: Emergency Medicine

## 2014-05-21 DIAGNOSIS — Z791 Long term (current) use of non-steroidal anti-inflammatories (NSAID): Secondary | ICD-10-CM | POA: Insufficient documentation

## 2014-05-21 DIAGNOSIS — G8929 Other chronic pain: Secondary | ICD-10-CM | POA: Insufficient documentation

## 2014-05-21 DIAGNOSIS — Z87891 Personal history of nicotine dependence: Secondary | ICD-10-CM | POA: Insufficient documentation

## 2014-05-21 DIAGNOSIS — K219 Gastro-esophageal reflux disease without esophagitis: Secondary | ICD-10-CM | POA: Insufficient documentation

## 2014-05-21 DIAGNOSIS — Z87442 Personal history of urinary calculi: Secondary | ICD-10-CM | POA: Insufficient documentation

## 2014-05-21 DIAGNOSIS — R0602 Shortness of breath: Secondary | ICD-10-CM | POA: Insufficient documentation

## 2014-05-21 DIAGNOSIS — J441 Chronic obstructive pulmonary disease with (acute) exacerbation: Secondary | ICD-10-CM | POA: Insufficient documentation

## 2014-05-21 MED ORDER — ALBUTEROL SULFATE HFA 108 (90 BASE) MCG/ACT IN AERS
2.0000 | INHALATION_SPRAY | RESPIRATORY_TRACT | Status: DC | PRN
  Administered 2014-05-21: 2 via RESPIRATORY_TRACT
  Filled 2014-05-21: qty 6.7

## 2014-05-21 MED ORDER — AEROCHAMBER PLUS W/MASK MISC
1.0000 | Freq: Once | Status: DC
  Filled 2014-05-21: qty 1

## 2014-05-21 MED ORDER — FLUTICASONE PROPIONATE HFA 44 MCG/ACT IN AERO
2.0000 | INHALATION_SPRAY | Freq: Two times a day (BID) | RESPIRATORY_TRACT | Status: DC
  Administered 2014-05-21: 2 via RESPIRATORY_TRACT
  Filled 2014-05-21: qty 10.6

## 2014-05-21 MED ORDER — ALBUTEROL SULFATE (2.5 MG/3ML) 0.083% IN NEBU
INHALATION_SOLUTION | RESPIRATORY_TRACT | Status: AC
  Filled 2014-05-21: qty 6

## 2014-05-21 MED ORDER — ALBUTEROL SULFATE (2.5 MG/3ML) 0.083% IN NEBU
5.0000 mg | INHALATION_SOLUTION | Freq: Four times a day (QID) | RESPIRATORY_TRACT | Status: DC | PRN
  Administered 2014-05-21: 5 mg via RESPIRATORY_TRACT

## 2014-05-21 NOTE — ED Notes (Signed)
Pt. Reports feeling short of breath tonight and reports he felt like he could not get his breath.  Pt. Is able to speak clear full sentences.  Pt. Reports no pain at present time.

## 2014-05-21 NOTE — ED Notes (Signed)
C/o sob  States wakes at night gasping,  States feels like something in lungs

## 2014-05-21 NOTE — ED Provider Notes (Signed)
CSN: [id number]     Arrival date & time 05/21/14  0028 History   First MD Initiated Contact with Patient 05/21/14 0225     Chief Complaint  Patient presents with  . Shortness of Breath     (Consider location/radiation/quality/duration/timing/severity/associated sxs/prior Treatment) HPI This is a 53 year old male who states that over the past several weeks he has had several episodes where he was awakened from sleep with a feeling that his throat was closed off and he couldn't breathe. After awakening he felt anxious and felt his heart racing. He states his wife has noticed heavy snoring as well. Associated with these episodes has been a tightness in his chest and the sensation that he could not breathe deeply. On arrival he was evaluated by our respiratory therapist and noted to have expiratory wheezes. He was given a neb treatment with significant subjective improvement. He admits to being a smoker. 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 He denies chest pain. He has occasional cough.  Past Medical History  Diagnosis Date  . GERD (gastroesophageal reflux disease)   . Chronic neck pain   . Chronic back pain   . Kidney stones   . Wears glasses   . Wears partial dentures    Past Surgical History  Procedure Laterality Date  . Finger surgery  1999    reattachement, left 1st - 4th  . Minor laceration repair     Family History  Problem Relation Age of Onset  . Arthritis Mother   . Diabetes Mother   . Cancer Father     prostate  . Arthritis Maternal Grandmother   . Heart disease Maternal Grandmother   . Arthritis Paternal Grandmother   . Stroke Paternal Grandmother   . Diabetes Paternal Grandmother   . Cancer Paternal Grandfather     colon  . Hyperlipidemia Paternal Grandfather   . Hypertension Paternal Grandfather    History  Substance Use Topics  . Smoking status: Former Smoker -- 1.00 packs/day for 25 years    Types: Cigarettes  . Smokeless tobacco: Not on file  .  Alcohol Use: No    Review of Systems  All other systems reviewed and are negative.   Allergies  Review of patient's allergies indicates no known allergies.  Home Medications   Prior to Admission medications   Medication Sig Start Date End Date Taking? Authorizing Provider  carisoprodol (SOMA) 350 MG tablet Take 1 tablet (350 mg total) by mouth 2 (two) times daily. 02/17/13   Robyn Haber, MD  cyclobenzaprine (FLEXERIL) 10 MG tablet Take 1 tablet (10 mg total) by mouth 3 (three) times daily as needed for muscle spasms. 05/11/13   Ellison Carwin, MD  gabapentin (NEURONTIN) 300 MG capsule Take 1 capsule (300 mg total) by mouth at bedtime. 01/15/13   Robyn Haber, MD  meloxicam (MOBIC) 15 MG tablet Take 15 mg by mouth daily.    Historical Provider, MD  meloxicam (MOBIC) 7.5 MG tablet Take 1 tablet (7.5 mg total) by mouth daily. 03/17/14   April K Palumbo-Rasch, MD  omeprazole (PRILOSEC) 20 MG capsule Take 1 capsule (20 mg total) by mouth daily. 01/15/13   Robyn Haber, MD  ondansetron (ZOFRAN) 4 MG tablet Take 1 tablet (4 mg total) by mouth every 8 (eight) hours as needed for nausea. 05/03/13   Clayton Bibles, PA-C  oxyCODONE (ROXICODONE) 15 MG immediate release tablet Take 1 tablet (15 mg total) by mouth every 8 (eight) hours as needed for pain. 05/11/13  Ellison Carwin, MD  Tamsulosin HCl (FLOMAX PO) Take by mouth.    Historical Provider, MD   BP 111/73  Pulse 87  Temp(Src) 97.5 F (36.4 C) (Oral)  Resp 20  Ht 6\' 1"  (1.854 m)  Wt 160 lb (72.576 kg)  BMI 21.11 kg/m2  SpO2 96%  Physical Exam General: Well-developed, well-nourished male in no acute distress; appearance consistent with age of record HENT: normocephalic; atraumatic Eyes: pupils equal, round and reactive to light; extraocular muscles intact Neck: supple Heart: regular rate and rhythm Lungs: clear to auscultation bilaterally Abdomen: soft; nondistended; nontender; no masses or hepatosplenomegaly; bowel sounds  present Extremities: No deformity; full range of motion; pulses normal Neurologic: Awake, alert and oriented; motor function intact in all extremities and symmetric; no facial droop Skin: Warm and dry Psychiatric: Normal mood and affect    ED Course  Procedures (including critical care time)  MDM  Nursing notes and vitals signs, including pulse oximetry, reviewed.  Summary of this visit's results, reviewed by myself:  Imaging Studies: Dg Chest 2 View  05/21/2014   CLINICAL DATA:  With shortness of breath for 3 weeks.  Some nausea.  EXAM: CHEST  2 VIEW  COMPARISON:  10/29/2009  FINDINGS: Emphysematous changes in the lungs. The heart size and mediastinal contours are within normal limits. Both lungs are clear. The visualized skeletal structures are unremarkable.  IMPRESSION: No active cardiopulmonary disease.   Electronically Signed   By: Lucienne Capers M.D.   On: 05/21/2014 01:29   2:41 AM The patient's complaints of waking from sleep are concerning for obstructive sleep apnea. This may be exacerbated by bronchospasm associated with his COPD. He has not been diagnosed with COPD in the past. We will start him on inhalers and refer him to [facility] pulmonology.     Wynetta Fines, MD 05/21/14 [phone]

## 2014-10-01 ENCOUNTER — Encounter (HOSPITAL_BASED_OUTPATIENT_CLINIC_OR_DEPARTMENT_OTHER): Payer: Self-pay | Admitting: *Deleted

## 2014-10-01 ENCOUNTER — Emergency Department (HOSPITAL_BASED_OUTPATIENT_CLINIC_OR_DEPARTMENT_OTHER)
Admission: EM | Admit: 2014-10-01 | Discharge: 2014-10-01 | Disposition: A | Discharge status: Home / Self Care | Payer: No Typology Code available for payment source | Attending: Emergency Medicine | Admitting: Emergency Medicine

## 2014-10-01 DIAGNOSIS — Y998 Other external cause status: Secondary | ICD-10-CM | POA: Diagnosis not present

## 2014-10-01 DIAGNOSIS — Y9389 Activity, other specified: Secondary | ICD-10-CM | POA: Diagnosis not present

## 2014-10-01 DIAGNOSIS — Z87891 Personal history of nicotine dependence: Secondary | ICD-10-CM | POA: Insufficient documentation

## 2014-10-01 DIAGNOSIS — Y9289 Other specified places as the place of occurrence of the external cause: Secondary | ICD-10-CM | POA: Diagnosis not present

## 2014-10-01 DIAGNOSIS — Z79899 Other long term (current) drug therapy: Secondary | ICD-10-CM | POA: Insufficient documentation

## 2014-10-01 DIAGNOSIS — Z98811 Dental restoration status: Secondary | ICD-10-CM | POA: Insufficient documentation

## 2014-10-01 DIAGNOSIS — T1511XA Foreign body in conjunctival sac, right eye, initial encounter: Secondary | ICD-10-CM | POA: Insufficient documentation

## 2014-10-01 DIAGNOSIS — X58XXXA Exposure to other specified factors, initial encounter: Secondary | ICD-10-CM | POA: Diagnosis not present

## 2014-10-01 DIAGNOSIS — K219 Gastro-esophageal reflux disease without esophagitis: Secondary | ICD-10-CM | POA: Diagnosis not present

## 2014-10-01 DIAGNOSIS — Z791 Long term (current) use of non-steroidal anti-inflammatories (NSAID): Secondary | ICD-10-CM | POA: Diagnosis not present

## 2014-10-01 DIAGNOSIS — T1591XA Foreign body on external eye, part unspecified, right eye, initial encounter: Secondary | ICD-10-CM | POA: Diagnosis present

## 2014-10-01 DIAGNOSIS — G8929 Other chronic pain: Secondary | ICD-10-CM | POA: Diagnosis not present

## 2014-10-01 DIAGNOSIS — S00251A Superficial foreign body of right eyelid and periocular area, initial encounter: Secondary | ICD-10-CM

## 2014-10-01 DIAGNOSIS — Z87442 Personal history of urinary calculi: Secondary | ICD-10-CM | POA: Insufficient documentation

## 2014-10-01 DIAGNOSIS — S0501XA Injury of conjunctiva and corneal abrasion without foreign body, right eye, initial encounter: Secondary | ICD-10-CM

## 2014-10-01 MED ORDER — TETRACAINE HCL 0.5 % OP SOLN
1.0000 [drp] | Freq: Once | OPHTHALMIC | Status: AC
  Administered 2014-10-01: 1 [drp] via OPHTHALMIC
  Filled 2014-10-01: qty 2

## 2014-10-01 MED ORDER — FLUORESCEIN SODIUM 1 MG OP STRP
1.0000 | ORAL_STRIP | Freq: Once | OPHTHALMIC | Status: AC
  Administered 2014-10-01: 1 via OPHTHALMIC
  Filled 2014-10-01: qty 1

## 2014-10-01 MED ORDER — HYDROCODONE-ACETAMINOPHEN 5-325 MG PO TABS: 1.0000 | ORAL_TABLET | ORAL | Status: DC | PRN

## 2014-10-01 MED ORDER — IBUPROFEN 400 MG PO TABS
400.0000 mg | ORAL_TABLET | Freq: Once | ORAL | Status: AC
  Administered 2014-10-01: 400 mg via ORAL
  Filled 2014-10-01 (×2): qty 1

## 2014-10-01 MED ORDER — ERYTHROMYCIN 5 MG/GM OP OINT: TOPICAL_OINTMENT | OPHTHALMIC | Status: DC

## 2014-10-01 NOTE — ED Provider Notes (Signed)
CSN: [id number]     Arrival date & time 10/01/14  2102 History   First MD Initiated Contact with Patient 10/01/14 2125     Chief Complaint  Patient presents with  . Foreign Body in [place]     (Consider location/radiation/quality/duration/timing/severity/associated sxs/prior Treatment) HPI Comments: 53 year old male presenting with right eye pain beginning about one hour prior to arrival. Patient reports he was grinding on a metal fender and felt steel get into his right eye. States he was using eye protection. Currently he states he has a foreign body sensation in the lateral corner of his eye causing sharp pain. Admits to blurred vision. No medications prior to arrival. Up-to-date on tetanus. He does not wear contacts.  Patient is a 53 y.o. male presenting with foreign body in eye. The history is provided by the patient.  Foreign Body in Eye    Past Medical History  Diagnosis Date  . GERD (gastroesophageal reflux disease)   . Chronic neck pain   . Chronic back pain   . Kidney stones   . Wears glasses   . Wears partial dentures    Past Surgical History  Procedure Laterality Date  . Finger surgery  1999    reattachement, left 1st - 4th  . Minor laceration repair     Family History  Problem Relation Age of Onset  . Arthritis Mother   . Diabetes Mother   . Cancer Father     prostate  . Arthritis Maternal Grandmother   . Heart disease Maternal Grandmother   . Arthritis Paternal Grandmother   . Stroke Paternal Grandmother   . Diabetes Paternal Grandmother   . Cancer Paternal Grandfather     colon  . Hyperlipidemia Paternal Grandfather   . Hypertension Paternal Grandfather    History  Substance Use Topics  . Smoking status: Former Smoker -- 1.00 packs/day for 25 years    Types: Cigarettes  . Smokeless tobacco: Not on file  . Alcohol Use: No    Review of Systems  Eyes: Positive for pain, redness and visual disturbance.  All other systems reviewed and are  negative.     Allergies  Review of patient's allergies indicates no known allergies.  Home Medications   Prior to Admission medications   Medication Sig Start Date End Date Taking? Authorizing Provider  carisoprodol (SOMA) 350 MG tablet Take 1 tablet (350 mg total) by mouth 2 (two) times daily. 02/17/13   Mignon Bechler Haber, MD  cyclobenzaprine (FLEXERIL) 10 MG tablet Take 1 tablet (10 mg total) by mouth 3 (three) times daily as needed for muscle spasms. 05/11/13   Roselee Culver, MD  erythromycin ophthalmic ointment Place a 1/2 inch ribbon of ointment into the lower eyelid every 6 hours 10/01/14   Carman Ching, PA-C  gabapentin (NEURONTIN) 300 MG capsule Take 1 capsule (300 mg total) by mouth at bedtime. 01/15/13   Jaquay Morneault Haber, MD  HYDROcodone-acetaminophen (NORCO/VICODIN) 5-325 MG per tablet Take 1-2 tablets by mouth every 4 (four) hours as needed. 10/01/14   Rohail Klees M Maisen Schmit, PA-C  meloxicam (MOBIC) 15 MG tablet Take 15 mg by mouth daily.    Historical Provider, MD  meloxicam (MOBIC) 7.5 MG tablet Take 1 tablet (7.5 mg total) by mouth daily. 03/17/14   April K Palumbo-Rasch, MD  omeprazole (PRILOSEC) 20 MG capsule Take 1 capsule (20 mg total) by mouth daily. 01/15/13   Maylon Sailors Haber, MD  ondansetron (ZOFRAN) 4 MG tablet Take 1 tablet (4 mg total) by mouth every  8 (eight) hours as needed for nausea. 05/03/13   Clayton Bibles, PA-C  oxyCODONE (ROXICODONE) 15 MG immediate release tablet Take 1 tablet (15 mg total) by mouth every 8 (eight) hours as needed for pain. 05/11/13   Roselee Culver, MD  Tamsulosin HCl (FLOMAX PO) Take by mouth.    Historical Provider, MD   BP 178/78 mmHg  Pulse 80  Temp(Src) 98.5 F (36.9 C) (Oral)  Resp 20  Ht 6\' 1"  (1.854 m)  Wt 165 lb (74.844 kg)  BMI 21.77 kg/m2  SpO2 100% Physical Exam  Constitutional: He is oriented to person, place, and time. He appears well-developed and well-nourished. No distress.  HENT:  Head: Normocephalic and atraumatic.  Eyes:  EOM are normal. Foreign body (tiny black FB removed from center of upper eyelid) present in the right eye. Right conjunctiva is injected.  Slit lamp exam:      The right eye shows corneal abrasion. The right eye shows no foreign body.  Neck: Normal range of motion. Neck supple.  Cardiovascular: Normal rate, regular rhythm and normal heart sounds.   Pulmonary/Chest: Effort normal and breath sounds normal.  Musculoskeletal: Normal range of motion. He exhibits no edema.  Neurological: He is alert and oriented to person, place, and time.  Skin: Skin is warm and dry.  Psychiatric: He has a normal mood and affect. His behavior is normal.  Nursing note and vitals reviewed.   ED Course  Procedures (including critical care time) Labs Review Labs Reviewed - No data to display  Imaging Review No results found.   EKG Interpretation None      MDM   Final diagnoses:  Foreign body of right eyelid  Right corneal abrasion, initial encounter   Small foreign body removed from upper right eyelid with Q-tip. Corneal abrasion noted. Immediately relief with topical tetracaine. No foreign body in his eye. Significant relief after foreign body removal from eyelid. Discharge home with pain control, topical antibiotics, follow-up with ophthalmology. Visual acuity obtained, Bilateral Near: 20/15 ; R Near: 20/25 ; L Near: 20/25. Stable for discharge. Return precautions given. Patient states understanding of treatment care plan and is agreeable.  Discussed with attending Dr. Doy Mince who assisted with ophthalmic examination.   Carman Ching, PA-C 10/01/14 2214  Artis Delay, MD 10/02/14 [phone]

## 2014-10-01 NOTE — ED Notes (Addendum)
Patient was grinding metal fender, he states did have safety glasses on, but steel debris still hit corner of eye.

## 2014-10-01 NOTE — Discharge Instructions (Signed)
Apply erythromycin ointment as prescribed. Apply warm compresses to your eye. Take Vicodin for severe pain only. No driving or operating heavy machinery while taking vicodin. This medication may cause drowsiness.  Corneal Abrasion The cornea is the clear covering at the front and center of the eye. When looking at the colored portion of the eye (iris), you are looking through the cornea. This very thin tissue is made up of many layers. The surface layer is a single layer of cells (corneal epithelium) and is one of the most sensitive tissues in the body. If a scratch or injury causes the corneal epithelium to come off, it is called a corneal abrasion. If the injury extends to the tissues below the epithelium, the condition is called a corneal ulcer. CAUSES   Scratches.  Trauma.  Foreign body in the eye. Some people have recurrences of abrasions in the area of the original injury even after it has healed (recurrent erosion syndrome). Recurrent erosion syndrome generally improves and goes away with time. SYMPTOMS   Eye pain.  Difficulty or inability to keep the injured eye open.  The eye becomes very sensitive to light.  Recurrent erosions tend to happen suddenly, first thing in the morning, usually after waking up and opening the eye. DIAGNOSIS  Your health care provider can diagnose a corneal abrasion during an eye exam. Dye is usually placed in the eye using a drop or a small paper strip moistened by your tears. When the eye is examined with a special light, the abrasion shows up clearly because of the dye. TREATMENT   Small abrasions may be treated with antibiotic drops or ointment alone.  A pressure patch may be put over the eye. If this is done, follow your doctor's instructions for when to remove the patch. Do not drive or use machines while the eye patch is on. Judging distances is hard to do with a patch on. If the abrasion becomes infected and spreads to the deeper tissues of the  cornea, a corneal ulcer can result. This is serious because it can cause corneal scarring. Corneal scars interfere with light passing through the cornea and cause a loss of vision in the involved eye. HOME CARE INSTRUCTIONS  Use medicine or ointment as directed. Only take over-the-counter or prescription medicines for pain, discomfort, or fever as directed by your health care provider.  Do not drive or operate machinery if your eye is patched. Your ability to judge distances is impaired.  If your health care provider has given you a follow-up appointment, it is very important to keep that appointment. Not keeping the appointment could result in a severe eye infection or permanent loss of vision. If there is any problem keeping the appointment, let your health care provider know. SEEK MEDICAL CARE IF:   You have pain, light sensitivity, and a scratchy feeling in one eye or both eyes.  Your pressure patch keeps loosening up, and you can blink your eye under the patch after treatment.  Any kind of discharge develops from the eye after treatment or if the lids stick together in the morning.  You have the same symptoms in the morning as you did with the original abrasion days, weeks, or months after the abrasion healed. MAKE SURE YOU:   Understand these instructions.  Will watch your condition.  Will get help right away if you are not doing well or get worse. Document Released: 10/04/2000 Document Revised: 10/12/2013 Document Reviewed: 06/14/2013 [facility] Patient Information 2015 [place], [place].  This information is not intended to replace advice given to you by your health care provider. Make sure you discuss any questions you have with your health care provider.  Eye, Foreign Body The term foreign body refers to any object near, on the surface of or in the eye that should not be there. A foreign body may be a small speck of dirt or dust, a hair or eyelash, a splinter or any object. CAUSES    Foreign bodies can get in the eye by:  Flying pieces of something that was broken or destroyed (debris).  A sudden injury (trauma) to the eye. SYMPTOMS  Symptoms depend on what the foreign body is and where it is in the eye. The most common locations are:  On the inner surface of the upper or lower eyelids or on the covering of the white part of the eye (conjunctiva). Symptoms in this location are:  Irritating and painful, especially when blinking.  Feeling like something is in the eye.  On the surface of the clear covering on the front of the eye (cornea). A corneal foreign body has symptoms that:  Are painful and irritating since the cornea is very sensitive.  Form small "rust rings" around a metallic foreign body. Metallic foreign bodies stick more firmly to the surface of the cornea.  Inside the eyeball. Infection can happen fast and can be hard to treat with antibiotics. This is an extremely dangerous situation. Foreign bodies inside the eye can threaten vision. A person may even loose their eye. Foreign bodies inside the eye may cause:  Great pain.  Immediate loss of vision. DIAGNOSIS  Foreign bodies are found during an exam by an eye specialist. Those that are on the eyelids, conjunctiva or cornea are usually (but not always) easily found. When a foreign body is inside the eyeball, a cataract may form almost right away. This makes it hard for an ophthalmologist to find the foreign body. Special tests may be needed, including ultrasound testing, X-rays and CT scans. TREATMENT   Foreign bodies that are on the eyelids, conjunctiva or cornea are often removed easily and painlessly.  If the foreign body has caused a scratch or abrasion of the cornea, antibiotic drops, ointments and/or a tight patch called a "pressure patch" may be needed. Follow-up exams will be needed for several days until the abrasion heals.  Surgery is needed right away if the foreign body is inside the  eyeball. This is a medical emergency. An antibiotic therapy will likely be given to stop an infection. HOME CARE INSTRUCTIONS  The use of eye patches is not universal. Their use varies from state to state and from caregiver to caregiver. If an eye patch was applied:  Keep the eye patch on for as long as directed by your caregiver until the follow-up appointment.  Do not remove the patch to put in medications unless instructed to do so. When replacing the patch, retape it as it was before. Follow the same procedure if the patch becomes loose.  WARNING: Do not drive or operate machinery while the eye is patched. The ability to judge distances will be impaired.  Only take over-the-counter or prescription medicines for pain, discomfort or fever as directed by the caregiver. If no eye patch was applied:  Keep the eye closed as much as possible. Do not rub the eye.  Wear dark glasses as needed to protect the eyes from bright light.  Do not wear contact lenses until the eye feels  normal again, or as instructed.  Wear protective eye covering if there is a risk of eye injury. This is important when working with high speed tools.  Only take over-the-counter or prescription medicines for pain, discomfort or fever as directed by the caregiver. SEEK IMMEDIATE MEDICAL CARE IF:   Pain increases in the eye or the vision changes.  You or your child has problems with the eye patch.  The injury to the eye appears to be getting larger.  There is discharge from the injured eye.  Swelling and/or soreness (inflammation) develops around the affected eye.  You or your child has an oral temperature above 102 F (38.9 C), not controlled by medicine.  Your baby is older than 3 months with a rectal temperature of 102 F (38.9 C) or higher.  Your baby is 21 months old or younger with a rectal temperature of 100.4 F (38 C) or higher. MAKE SURE YOU:   Understand these instructions.  Will watch your  condition.  Will get help right away if you are not doing well or get worse. Document Released: 10/07/2005 Document Revised: 12/30/2011 Document Reviewed: 03/04/2013 [facility] Patient Information 2015 [place], [place]. This information is not intended to replace advice given to you by your health care provider. Make sure you discuss any questions you have with your health care provider.

## 2014-10-01 NOTE — ED Notes (Signed)
Dr. Doy Mince at [facility] with slit lamp. (Ibuprofen, tetracaine and fluorescein strip at [facility])

## 2014-10-01 NOTE — ED Notes (Addendum)
Was grinding steel, states, "got steel in my R eye ~ 1 hr ago, it was red hot". No meds PTA. Td UTD. R eye red and tearing.

## 2014-10-02 NOTE — ED Provider Notes (Signed)
CSN: [id number]     Arrival date & time 10/01/14  2102 History   First MD Initiated Contact with Patient 10/01/14 2125     Chief Complaint  Patient presents with  . Foreign Body in [place]     (Consider location/radiation/quality/duration/timing/severity/associated sxs/prior Treatment) HPI  Past Medical History  Diagnosis Date  . GERD (gastroesophageal reflux disease)   . Chronic neck pain   . Chronic back pain   . Kidney stones   . Wears glasses   . Wears partial dentures    Past Surgical History  Procedure Laterality Date  . Finger surgery  1999    reattachement, left 1st - 4th  . Minor laceration repair     Family History  Problem Relation Age of Onset  . Arthritis Mother   . Diabetes Mother   . Cancer Father     prostate  . Arthritis Maternal Grandmother   . Heart disease Maternal Grandmother   . Arthritis Paternal Grandmother   . Stroke Paternal Grandmother   . Diabetes Paternal Grandmother   . Cancer Paternal Grandfather     colon  . Hyperlipidemia Paternal Grandfather   . Hypertension Paternal Grandfather    History  Substance Use Topics  . Smoking status: Former Smoker -- 1.00 packs/day for 25 years    Types: Cigarettes  . Smokeless tobacco: Not on file  . Alcohol Use: No    Review of Systems    Allergies  Review of patient's allergies indicates no known allergies.  Home Medications   Prior to Admission medications   Medication Sig Start Date End Date Taking? Authorizing Provider  carisoprodol (SOMA) 350 MG tablet Take 1 tablet (350 mg total) by mouth 2 (two) times daily. 02/17/13   Robyn Haber, MD  cyclobenzaprine (FLEXERIL) 10 MG tablet Take 1 tablet (10 mg total) by mouth 3 (three) times daily as needed for muscle spasms. 05/11/13   Roselee Culver, MD  erythromycin ophthalmic ointment Place a 1/2 inch ribbon of ointment into the lower eyelid every 6 hours 10/01/14   Carman Ching, PA-C  gabapentin (NEURONTIN) 300 MG capsule Take 1 capsule  (300 mg total) by mouth at bedtime. 01/15/13   Robyn Haber, MD  HYDROcodone-acetaminophen (NORCO/VICODIN) 5-325 MG per tablet Take 1-2 tablets by mouth every 4 (four) hours as needed. 10/01/14   Robyn M Hess, PA-C  meloxicam (MOBIC) 15 MG tablet Take 15 mg by mouth daily.    Historical Provider, MD  meloxicam (MOBIC) 7.5 MG tablet Take 1 tablet (7.5 mg total) by mouth daily. 03/17/14   April K Palumbo-Rasch, MD  omeprazole (PRILOSEC) 20 MG capsule Take 1 capsule (20 mg total) by mouth daily. 01/15/13   Robyn Haber, MD  ondansetron (ZOFRAN) 4 MG tablet Take 1 tablet (4 mg total) by mouth every 8 (eight) hours as needed for nausea. 05/03/13   Clayton Bibles, PA-C  oxyCODONE (ROXICODONE) 15 MG immediate release tablet Take 1 tablet (15 mg total) by mouth every 8 (eight) hours as needed for pain. 05/11/13   Roselee Culver, MD  Tamsulosin HCl (FLOMAX PO) Take by mouth.    Historical Provider, MD   BP 178/78 mmHg  Pulse 80  Temp(Src) 98.5 F (36.9 C) (Oral)  Resp 20  Ht 6\' 1"  (1.854 m)  Wt 165 lb (74.844 kg)  BMI 21.77 kg/m2  SpO2 100% Physical Exam  Constitutional: He is oriented to person, place, and time. He appears well-developed and well-nourished. No distress.  HENT:  Head: Normocephalic  and atraumatic.  Eyes: Conjunctivae and EOM are normal. Pupils are equal, round, and reactive to light. No scleral icterus.  Slit lamp exam:      The right eye shows corneal abrasion and foreign body (small foreign body found after everting upper eyelid.  ).  Neck: Neck supple.  Cardiovascular: Normal rate and intact distal pulses.   Pulmonary/Chest: Effort normal. No stridor. No respiratory distress.  Abdominal: Normal appearance. He exhibits no distension.  Neurological: He is alert and oriented to person, place, and time.  Skin: Skin is warm and dry. No rash noted.  Psychiatric: He has a normal mood and affect. His behavior is normal.  Nursing note and vitals reviewed.   ED Course  FOREIGN  BODY REMOVAL Date/Time: 10/02/2014 4:04 PM Performed by: Artis Delay Authorized by: Artis Delay Consent: Verbal consent obtained. Body area: eye Location details: right eyelid Local anesthetic: tetracaine drops Localization method: eyelid eversion and slit lamp Removal mechanism: eyelid eversion and moist cotton swab Eye examined with fluorescein. Corneal abrasion size: small Corneal abrasion location: medial No residual rust ring present. Depth: superficial Complexity: simple 1 objects recovered. Post-procedure assessment: foreign body removed Patient tolerance: Patient tolerated the procedure well with no immediate complications   (including critical care time) Labs Review Labs Reviewed - No data to display  Imaging Review No results found.   EKG Interpretation None      MDM   Final diagnoses:  Foreign body of right eyelid  Right corneal abrasion, initial encounter    53 yo male with foreign body sensation in right eye after working on metal.  Foreign body difficult to locate, but found and removed with PA Albert's assistance.  DC'd with abx ointment.  He does not wear contacts.     Artis Delay, MD 10/02/14 [phone]

## 2014-12-09 ENCOUNTER — Emergency Department (HOSPITAL_BASED_OUTPATIENT_CLINIC_OR_DEPARTMENT_OTHER)
Admission: EM | Admit: 2014-12-09 | Discharge: 2014-12-09 | Disposition: A | Discharge status: Home / Self Care | Payer: No Typology Code available for payment source | Attending: Emergency Medicine | Admitting: Emergency Medicine

## 2014-12-09 ENCOUNTER — Encounter (HOSPITAL_BASED_OUTPATIENT_CLINIC_OR_DEPARTMENT_OTHER): Payer: Self-pay

## 2014-12-09 DIAGNOSIS — K219 Gastro-esophageal reflux disease without esophagitis: Secondary | ICD-10-CM | POA: Insufficient documentation

## 2014-12-09 DIAGNOSIS — Z87442 Personal history of urinary calculi: Secondary | ICD-10-CM | POA: Insufficient documentation

## 2014-12-09 DIAGNOSIS — Z87891 Personal history of nicotine dependence: Secondary | ICD-10-CM | POA: Insufficient documentation

## 2014-12-09 DIAGNOSIS — G8929 Other chronic pain: Secondary | ICD-10-CM | POA: Insufficient documentation

## 2014-12-09 DIAGNOSIS — Z791 Long term (current) use of non-steroidal anti-inflammatories (NSAID): Secondary | ICD-10-CM | POA: Insufficient documentation

## 2014-12-09 DIAGNOSIS — J40 Bronchitis, not specified as acute or chronic: Secondary | ICD-10-CM | POA: Insufficient documentation

## 2014-12-09 DIAGNOSIS — Z79899 Other long term (current) drug therapy: Secondary | ICD-10-CM | POA: Insufficient documentation

## 2014-12-09 MED ORDER — ALBUTEROL SULFATE HFA 108 (90 BASE) MCG/ACT IN AERS
2.0000 | INHALATION_SPRAY | RESPIRATORY_TRACT | Status: DC
  Administered 2014-12-09: 2 via RESPIRATORY_TRACT
  Filled 2014-12-09: qty 6.7

## 2014-12-09 MED ORDER — PREDNISONE 50 MG PO TABS
60.0000 mg | ORAL_TABLET | Freq: Once | ORAL | Status: AC
  Administered 2014-12-09: 60 mg via ORAL
  Filled 2014-12-09 (×2): qty 1

## 2014-12-09 MED ORDER — PREDNISONE 10 MG PO TABS: 20.0000 mg | ORAL_TABLET | Freq: Every day | ORAL | Status: DC

## 2014-12-09 NOTE — ED Notes (Signed)
MD at bedside. 

## 2014-12-09 NOTE — ED Notes (Signed)
Reports cough and shortness of breath in the mornings. Sts he feels okay throughout the day. Productive cough with clear sputum.

## 2014-12-09 NOTE — Discharge Instructions (Signed)
Do not start taking the prednisone until tonight. Follow up with your doctor to schedule the appointment with the pulmonologist Acute Bronchitis Bronchitis is inflammation of the airways that extend from the windpipe into the lungs (bronchi). The inflammation often causes mucus to develop. This leads to a cough, which is the most common symptom of bronchitis.  In acute bronchitis, the condition usually develops suddenly and goes away over time, usually in a couple weeks. Smoking, allergies, and asthma can make bronchitis worse. Repeated episodes of bronchitis may cause further lung problems.  CAUSES Acute bronchitis is most often caused by the same virus that causes a cold. The virus can spread from person to person (contagious) through coughing, sneezing, and touching contaminated objects. SIGNS AND SYMPTOMS   Cough.   Fever.   Coughing up mucus.   Body aches.   Chest congestion.   Chills.   Shortness of breath.   Sore throat.  DIAGNOSIS  Acute bronchitis is usually diagnosed through a physical exam. Your health care provider will also ask you questions about your medical history. Tests, such as chest X-rays, are sometimes done to rule out other conditions.  TREATMENT  Acute bronchitis usually goes away in a couple weeks. Oftentimes, no medical treatment is necessary. Medicines are sometimes given for relief of fever or cough. Antibiotic medicines are usually not needed but may be prescribed in certain situations. In some cases, an inhaler may be recommended to help reduce shortness of breath and control the cough. A cool mist vaporizer may also be used to help thin bronchial secretions and make it easier to clear the chest.  HOME CARE INSTRUCTIONS  Get plenty of rest.   Drink enough fluids to keep your urine clear or pale yellow (unless you have a medical condition that requires fluid restriction). Increasing fluids may help thin your respiratory secretions (sputum) and  reduce chest congestion, and it will prevent dehydration.   Take medicines only as directed by your health care provider.  If you were prescribed an antibiotic medicine, finish it all even if you start to feel better.  Avoid smoking and secondhand smoke. Exposure to cigarette smoke or irritating chemicals will make bronchitis worse. If you are a smoker, consider using nicotine gum or skin patches to help control withdrawal symptoms. Quitting smoking will help your lungs heal faster.   Reduce the chances of another bout of acute bronchitis by washing your hands frequently, avoiding people with cold symptoms, and trying not to touch your hands to your mouth, nose, or eyes.   Keep all follow-up visits as directed by your health care provider.  SEEK MEDICAL CARE IF: Your symptoms do not improve after 1 week of treatment.  SEEK IMMEDIATE MEDICAL CARE IF:  You develop an increased fever or chills.   You have chest pain.   You have severe shortness of breath.  You have bloody sputum.   You develop dehydration.  You faint or repeatedly feel like you are going to pass out.  You develop repeated vomiting.  You develop a severe headache. MAKE SURE YOU:   Understand these instructions.  Will watch your condition.  Will get help right away if you are not doing well or get worse. Document Released: 11/14/2004 Document Revised: 02/21/2014 Document Reviewed: 03/30/2013 [facility] Patient Information 2015 [place], [place]. This information is not intended to replace advice given to you by your health care provider. Make sure you discuss any questions you have with your health care provider.

## 2014-12-09 NOTE — ED Provider Notes (Signed)
CSN: [id number]     Arrival date & time 12/09/14  1324 History   First MD Initiated Contact with Patient 12/09/14 [phone]     Chief Complaint  Patient presents with  . Shortness of Breath     (Consider location/radiation/quality/duration/timing/severity/associated sxs/prior Treatment) HPI Comments: Patient here complaining of intermittent cough times several days. History of similar symptoms in the past and was diagnosed with possible COPD refer to pulmonology which was not completed. Santiago Glad cough is nonproductive and worse in the mornings. He denies any fever or chills. No vomiting or diarrhea.No associated hemoptysis, weight loss, night sweats. Denies any anginal type chest pain. No CHF symptoms. Symptoms gradually improved throughout the day. Patient does work around Loss adjuster, chartered. Denies any shortness at this time.  Patient is a 54 y.o. male presenting with shortness of breath. The history is provided by the patient.  Shortness of Breath   Past Medical History  Diagnosis Date  . GERD (gastroesophageal reflux disease)   . Chronic neck pain   . Chronic back pain   . Kidney stones   . Wears glasses   . Wears partial dentures    Past Surgical History  Procedure Laterality Date  . Finger surgery  1999    reattachement, left 1st - 4th  . Minor laceration repair     Family History  Problem Relation Age of Onset  . Arthritis Mother   . Diabetes Mother   . Cancer Father     prostate  . Arthritis Maternal Grandmother   . Heart disease Maternal Grandmother   . Arthritis Paternal Grandmother   . Stroke Paternal Grandmother   . Diabetes Paternal Grandmother   . Cancer Paternal Grandfather     colon  . Hyperlipidemia Paternal Grandfather   . Hypertension Paternal Grandfather    History  Substance Use Topics  . Smoking status: Former Smoker -- 1.00 packs/day for 25 years    Types: Cigarettes  . Smokeless tobacco: Not on file  . Alcohol Use: No    Review of Systems  Respiratory:  Positive for shortness of breath.   All other systems reviewed and are negative.     Allergies  Review of patient's allergies indicates no known allergies.  Home Medications   Prior to Admission medications   Medication Sig Start Date End Date Taking? Authorizing Provider  carisoprodol (SOMA) 350 MG tablet Take 1 tablet (350 mg total) by mouth 2 (two) times daily. 02/17/13   Robyn Haber, MD  cyclobenzaprine (FLEXERIL) 10 MG tablet Take 1 tablet (10 mg total) by mouth 3 (three) times daily as needed for muscle spasms. 05/11/13   Roselee Culver, MD  gabapentin (NEURONTIN) 300 MG capsule Take 1 capsule (300 mg total) by mouth at bedtime. 01/15/13   Robyn Haber, MD  HYDROcodone-acetaminophen (NORCO/VICODIN) 5-325 MG per tablet Take 1-2 tablets by mouth every 4 (four) hours as needed. 10/01/14   Robyn M Hess, PA-C  meloxicam (MOBIC) 15 MG tablet Take 15 mg by mouth daily.    Historical Provider, MD  meloxicam (MOBIC) 7.5 MG tablet Take 1 tablet (7.5 mg total) by mouth daily. 03/17/14   April K Palumbo-Rasch, MD  omeprazole (PRILOSEC) 20 MG capsule Take 1 capsule (20 mg total) by mouth daily. 01/15/13   Robyn Haber, MD  oxyCODONE (ROXICODONE) 15 MG immediate release tablet Take 1 tablet (15 mg total) by mouth every 8 (eight) hours as needed for pain. 05/11/13   Roselee Culver, MD  predniSONE (DELTASONE) 10 MG tablet Take  2 tablets (20 mg total) by mouth daily. 12/09/14   Leota Jacobsen, MD  Tamsulosin HCl (FLOMAX PO) Take by mouth.    Historical Provider, MD   BP 110/74 mmHg  Pulse 78  Temp(Src) 97.9 F (36.6 C) (Oral)  Resp 16  Ht 6\' 1"  (1.854 m)  Wt 165 lb (74.844 kg)  BMI 21.77 kg/m2  SpO2 98% Physical Exam  Constitutional: He is oriented to person, place, and time. He appears well-developed and well-nourished.  Non-toxic appearance. No distress.  HENT:  Head: Normocephalic and atraumatic.  Eyes: Conjunctivae, EOM and lids are normal. Pupils are equal, round, and  reactive to light.  Neck: Normal range of motion. Neck supple. No tracheal deviation present. No thyroid mass present.  Cardiovascular: Normal rate, regular rhythm and normal heart sounds.  Exam reveals no gallop.   No murmur heard. Pulmonary/Chest: Effort normal. No stridor. No respiratory distress. He has decreased breath sounds. He has no wheezes. He has no rhonchi. He has no rales.  Abdominal: Soft. Normal appearance and bowel sounds are normal. He exhibits no distension. There is no tenderness. There is no rebound and no CVA tenderness.  Musculoskeletal: Normal range of motion. He exhibits no edema or tenderness.  Neurological: He is alert and oriented to person, place, and time. He has normal strength. No cranial nerve deficit or sensory deficit. GCS eye subscore is 4. GCS verbal subscore is 5. GCS motor subscore is 6.  Skin: Skin is warm and dry. No abrasion and no rash noted.  Psychiatric: He has a normal mood and affect. His speech is normal and behavior is normal.  Nursing note and vitals reviewed.   ED Course  Procedures (including critical care time) Labs Review Labs Reviewed - No data to display  Imaging Review No results found.   EKG Interpretation None      MDM   Final diagnoses:  Bronchitis    Patient has a long history of tobacco use in the past. Suspect he does have some component of bronchospasm and/or COPD. Patient instructed to follow-up with his primary care physician to arrange pulmonology consultation. Was given prednisone and albuterol here.    Leota Jacobsen, MD 12/09/14 [phone]

## 2015-01-25 DIAGNOSIS — J4540 Moderate persistent asthma, uncomplicated: Secondary | ICD-10-CM

## 2015-01-25 HISTORY — DX: Moderate persistent asthma, uncomplicated: J45.40

## 2015-05-15 DIAGNOSIS — F1110 Opioid abuse, uncomplicated: Secondary | ICD-10-CM

## 2015-07-13 DIAGNOSIS — F1120 Opioid dependence, uncomplicated: Secondary | ICD-10-CM | POA: Insufficient documentation

## 2015-07-13 DIAGNOSIS — Z2829 Immunization not carried out because of patient decision for other reason: Secondary | ICD-10-CM | POA: Insufficient documentation

## 2016-05-10 ENCOUNTER — Emergency Department (HOSPITAL_BASED_OUTPATIENT_CLINIC_OR_DEPARTMENT_OTHER)
Admission: EM | Admit: 2016-05-10 | Discharge: 2016-05-10 | Disposition: A | Discharge status: Home / Self Care | Payer: 59 | Attending: Emergency Medicine | Admitting: Emergency Medicine

## 2016-05-10 ENCOUNTER — Encounter (HOSPITAL_BASED_OUTPATIENT_CLINIC_OR_DEPARTMENT_OTHER): Payer: Self-pay

## 2016-05-10 DIAGNOSIS — E860 Dehydration: Secondary | ICD-10-CM | POA: Diagnosis not present

## 2016-05-10 DIAGNOSIS — Z87891 Personal history of nicotine dependence: Secondary | ICD-10-CM | POA: Diagnosis not present

## 2016-05-10 DIAGNOSIS — R42 Dizziness and giddiness: Secondary | ICD-10-CM | POA: Diagnosis present

## 2016-05-10 LAB — CBC
HCT: 42.7 % (ref 39.0–52.0)
Hemoglobin: 14.3 g/dL (ref 13.0–17.0)
MCH: 28.7 pg (ref 26.0–34.0)
MCHC: 33.5 g/dL (ref 30.0–36.0)
MCV: 85.6 fL (ref 78.0–100.0)
PLATELETS: 213 10*3/uL (ref 150–400)
RBC: 4.99 MIL/uL (ref 4.22–5.81)
RDW: 13.8 % (ref 11.5–15.5)
WBC: 7.9 10*3/uL (ref 4.0–10.5)

## 2016-05-10 LAB — BASIC METABOLIC PANEL
Anion gap: 9 (ref 5–15)
BUN: 13 mg/dL (ref 6–20)
CALCIUM: 9.2 mg/dL (ref 8.9–10.3)
CO2: 26 mmol/L (ref 22–32)
Chloride: 104 mmol/L (ref 101–111)
Creatinine, Ser: 0.95 mg/dL (ref 0.61–1.24)
GFR calc Af Amer: >60 mL/min (ref >60)
GLUCOSE: 107 mg/dL — AB (ref 65–99)
Potassium: 4.4 mmol/L (ref 3.5–5.1)
Sodium: 139 mmol/L (ref 135–145)

## 2016-05-10 MED ORDER — SODIUM CHLORIDE 0.9 % IV BOLUS (SEPSIS)
1000.0000 mL | Freq: Once | INTRAVENOUS | Status: AC
  Administered 2016-05-10: 1000 mL via INTRAVENOUS

## 2016-05-10 NOTE — ED Provider Notes (Signed)
CSN: [id number]     Arrival date & time 05/10/16  1215 History   First MD Initiated Contact with Patient 05/10/16 1235     Chief Complaint  Patient presents with  . Dizziness   (Consider location/radiation/quality/duration/timing/severity/associated sxs/prior Treatment) HPI 55 y.o. 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 Noted decrease in appetite as well. Recently stopped taking Heroin/nacotic on Saturday and started on Methadone on Monday. No N/V/D. No headaches. No vision changes. No CP/SOB/ABD pain. Pt states that he has been feeling weak and dehydrated this AM. No melena. No hematochezia. No other symptoms noted.    Past Medical History  Diagnosis Date  . GERD (gastroesophageal reflux disease)   . Chronic neck pain   . Chronic back pain   . Kidney stones   . Wears glasses   . Wears partial dentures    Past Surgical History  Procedure Laterality Date  . Finger surgery  1999    reattachement, left 1st - 4th  . Minor laceration repair     Family History  Problem Relation Age of Onset  . Arthritis Mother   . Diabetes Mother   . Cancer Father     prostate  . Arthritis Maternal Grandmother   . Heart disease Maternal Grandmother   . Arthritis Paternal Grandmother   . Stroke Paternal Grandmother   . Diabetes Paternal Grandmother   . Cancer Paternal Grandfather     colon  . Hyperlipidemia Paternal Grandfather   . Hypertension Paternal Grandfather    Social History  Substance Use Topics  . Smoking status: Former Smoker -- 1.00 packs/day for 25 years    Types: Cigarettes  . Smokeless tobacco: None  . Alcohol Use: Yes    Review of Systems ROS reviewed and all are negative for acute change except as noted in the HPI.  Allergies  Review of patient's allergies indicates no known allergies.  Home Medications   Prior to Admission medications   Medication Sig Start Date  End Date Taking? Authorizing Provider  carisoprodol (SOMA) 350 MG tablet Take 1 tablet (350 mg total) by mouth 2 (two) times daily. 02/17/13   Robyn Haber, MD  cyclobenzaprine (FLEXERIL) 10 MG tablet Take 1 tablet (10 mg total) by mouth 3 (three) times daily as needed for muscle spasms. 05/11/13   Roselee Culver, MD  gabapentin (NEURONTIN) 300 MG capsule Take 1 capsule (300 mg total) by mouth at bedtime. 01/15/13   Robyn Haber, MD  HYDROcodone-acetaminophen (NORCO/VICODIN) 5-325 MG per tablet Take 1-2 tablets by mouth every 4 (four) hours as needed. 10/01/14   Robyn M Hess, PA-C  meloxicam (MOBIC) 15 MG tablet Take 15 mg by mouth daily.    Historical Provider, MD  meloxicam (MOBIC) 7.5 MG tablet Take 1 tablet (7.5 mg total) by mouth daily. 03/17/14   April Palumbo, MD  omeprazole (PRILOSEC) 20 MG capsule Take 1 capsule (20 mg total) by mouth daily. 01/15/13   Robyn Haber, MD  oxyCODONE (ROXICODONE) 15 MG immediate release tablet Take 1 tablet (15 mg total) by mouth every 8 (eight) hours as needed for pain. 05/11/13   Roselee Culver, MD  predniSONE (DELTASONE) 10 MG tablet Take 2 tablets (20 mg total) by mouth daily. 12/09/14   Lacretia Leigh, MD  Tamsulosin HCl (FLOMAX PO) Take by mouth.    Historical Provider, MD   BP 103/70 mmHg  Pulse 66  Temp(Src) 97.7 F (36.5  C) (Oral)  Resp 16  Ht 6\' 1"  (1.854 m)  Wt 74.844 kg  BMI 21.77 kg/m2  SpO2 99%   Physical Exam  Constitutional: He is oriented to person, place, and time. He appears well-developed and well-nourished.  HENT:  Head: Normocephalic and atraumatic.  Conjunctiva Pale. Lips Dry  Eyes: EOM are normal. Pupils are equal, round, and reactive to light.  Neck: Normal range of motion. Neck supple.  Cardiovascular: Normal rate, regular rhythm, normal heart sounds and intact distal pulses.   No murmur heard. Pulmonary/Chest: Effort normal and breath sounds normal. No respiratory distress. He has no wheezes. He has no rales. He  exhibits no tenderness.  Abdominal: Soft.  Musculoskeletal: Normal range of motion.  Neurological: He is alert and oriented to person, place, and time. He has normal strength. No cranial nerve deficit or sensory deficit.  Cranial Nerves:  II: Pupils equal, round, reactive to light III,IV, VI: ptosis not present, extra-ocular motions intact bilaterally  V,VII: smile symmetric, facial light touch sensation equal VIII: hearing grossly normal bilaterally  IX,X: midline uvula rise  XI: bilateral shoulder shrug equal and strong XII: midline tongue extension  Skin: Skin is warm and dry.  Psychiatric: He has a normal mood and affect. His behavior is normal. Thought content normal.  Nursing note and vitals reviewed.  ED Course  Procedures (including critical care time) Labs Review Labs Reviewed  BASIC METABOLIC PANEL - Abnormal; Notable for the following:    Glucose, Bld 107 (*)    All other components within normal limits  CBC   Imaging Review No results found. I have personally reviewed and evaluated these images and lab results as part of my medical decision-making.   EKG Interpretation   Date/Time:  Friday May 10 2016 12:24:53 EDT Ventricular Rate:  64 PR Interval:    QRS Duration: 94 QT Interval:  435 QTC Calculation: 449 R Axis:   82 Text Interpretation:  Sinus rhythm Benign early repolarization No  significant change since last tracing Confirmed by KNOTT MD, Quillian Quince  [id number]) on 05/10/2016 12:34:33 PM      MDM  I have reviewed and evaluated the relevant laboratory values I have interpreted the relevant EKG. I have reviewed the relevant previous healthcare records. I have reviewed EMS Documentation. I obtained HPI from historian. Patient discussed with supervising physician  ED Course:  Assessment: Pt is a 42yM with hx substance abuse currently on Methadone who presents with dizziness with onset this AM. Recently off of heroinin/narcotics this past Saturday. On  Methadone that started Monday. On exam, pt in NAD. Nontoxic/nonseptic appearing. VSS. Afebrile. Lungs CTA. Heart RRR. Abdomen nontender soft. CN evaluated and unremarakble. No tremors on exam. Labs unremarkable. No anemia. No electrolyte abnormalities. Orthostatics obtained and consistent with dehydration. Pt also has pale conjunctiva and dry mucous membranes. Pt does not drink water and drinks mainly sodas. Given 2L NS bolus with improvement of symptoms in ED. Pt could also be experiencing symptoms due to mixing methadone with alcohol as he drinks in the AM "to take the edge off." Plan is to DC home with follow up to PCP. At time of discharge, Patient is in no acute distress. Vital Signs are stable. Patient is able to ambulate. Patient able to tolerate PO.    Disposition/Plan:  DC Home Additional Verbal discharge instructions given and discussed with patient.  Pt Instructed to f/u with PCP in the next week for evaluation and treatment of symptoms. Return precautions given Pt acknowledges and agrees  with plan  Supervising Physician Leo Grosser, MD   Final diagnoses:  Dehydration     Shary Decamp, PA-C 05/10/16 1343  Leo Grosser, MD 05/10/16 1743

## 2016-05-10 NOTE — Discharge Instructions (Signed)
Please read and follow all provided instructions.  Your diagnoses today include:  1. Dehydration    Tests performed today include:  Vital signs. See below for your results today.   Medications prescribed:   Take as prescribed   Home care instructions:  Follow any educational materials contained in this packet.  Follow-up instructions: Please follow-up with your primary care provider for further evaluation of symptoms and treatment   Return instructions:   Please return to the Emergency Department if you do not get better, if you get worse, or new symptoms OR  - Fever (temperature greater than 101.30F)  - Bleeding that does not stop with holding pressure to the area    -Severe pain (please note that you may be more sore the day after your accident)  - Chest Pain  - Difficulty breathing  - Severe nausea or vomiting  - Inability to tolerate food and liquids  - Passing out  - Skin becoming red around your wounds  - Change in mental status (confusion or lethargy)  - New numbness or weakness     Please return if you have any other emergent concerns.  Additional Information:  Your vital signs today were: BP 103/70 mmHg   Pulse 66   Temp(Src) 97.7 F (36.5 C) (Oral)   Resp 16   Ht 6\' 1"  (1.854 m)   Wt 74.844 kg   BMI 21.77 kg/m2   SpO2 99% If your blood pressure (BP) was elevated above 135/85 this visit, please have this repeated by your doctor within one month. ---------------

## 2016-05-10 NOTE — ED Notes (Signed)
Pt reports feeling dizzy since 1145 this morning. Reports decreased appetite due to recently starting methadone.

## 2016-05-10 NOTE — ED Notes (Signed)
PA at bedside.

## 2016-05-10 NOTE — ED Notes (Signed)
Pt brought in by [facility] for dizziness that started about 1145 this morning. Reports he was outside when this happened. Pt admits to drinking a shot this morning. Also reports taking methadone as well,. Pt reports he is on methadone for pain medicine and heroin. Pt started the methadone on Monday. Reports drinking alcohol every morning since starting.

## 2016-05-10 NOTE — ED Notes (Signed)
Pt provided with ice water per PA.

## 2017-06-02 DIAGNOSIS — B182 Chronic viral hepatitis C: Secondary | ICD-10-CM

## 2017-07-25 DIAGNOSIS — F17210 Nicotine dependence, cigarettes, uncomplicated: Secondary | ICD-10-CM

## 2017-09-15 ENCOUNTER — Other Ambulatory Visit: Payer: Self-pay

## 2017-09-15 ENCOUNTER — Emergency Department (HOSPITAL_BASED_OUTPATIENT_CLINIC_OR_DEPARTMENT_OTHER)
Admission: EM | Admit: 2017-09-15 | Discharge: 2017-09-15 | Disposition: A | Discharge status: Home / Self Care | Payer: 59 | Attending: Emergency Medicine | Admitting: Emergency Medicine

## 2017-09-15 ENCOUNTER — Encounter (HOSPITAL_BASED_OUTPATIENT_CLINIC_OR_DEPARTMENT_OTHER): Payer: Self-pay

## 2017-09-15 DIAGNOSIS — Z87891 Personal history of nicotine dependence: Secondary | ICD-10-CM | POA: Diagnosis not present

## 2017-09-15 DIAGNOSIS — G43A0 Cyclical vomiting, not intractable: Secondary | ICD-10-CM | POA: Diagnosis not present

## 2017-09-15 DIAGNOSIS — R1115 Cyclical vomiting syndrome unrelated to migraine: Secondary | ICD-10-CM

## 2017-09-15 DIAGNOSIS — R109 Unspecified abdominal pain: Secondary | ICD-10-CM | POA: Diagnosis not present

## 2017-09-15 DIAGNOSIS — R1013 Epigastric pain: Secondary | ICD-10-CM | POA: Diagnosis present

## 2017-09-15 HISTORY — DX: Other psychoactive substance abuse, uncomplicated: F19.10

## 2017-09-15 HISTORY — DX: Opioid use, unspecified, uncomplicated: F11.90

## 2017-09-15 LAB — COMPREHENSIVE METABOLIC PANEL
ALT: 20 U/L (ref 17–63)
AST: 20 U/L (ref 15–41)
Albumin: 4.4 g/dL (ref 3.5–5.0)
Alkaline Phosphatase: 62 U/L (ref 38–126)
Anion gap: 7 (ref 5–15)
BUN: 10 mg/dL (ref 6–20)
CO2: 29 mmol/L (ref 22–32)
CREATININE: 1.02 mg/dL (ref 0.61–1.24)
Calcium: 9.3 mg/dL (ref 8.9–10.3)
Chloride: 99 mmol/L — ABNORMAL LOW (ref 101–111)
Glucose, Bld: 97 mg/dL (ref 65–99)
Potassium: 3.4 mmol/L — ABNORMAL LOW (ref 3.5–5.1)
Sodium: 135 mmol/L (ref 135–145)
Total Bilirubin: 1 mg/dL (ref 0.3–1.2)
Total Protein: 7.1 g/dL (ref 6.5–8.1)

## 2017-09-15 LAB — LIPASE, BLOOD: Lipase: 82 U/L — ABNORMAL HIGH (ref 11–51)

## 2017-09-15 LAB — CBC WITH DIFFERENTIAL/PLATELET
Basophils Absolute: 0.1 10*3/uL (ref 0.0–0.1)
Basophils Relative: 1 %
Eosinophils Absolute: 0.1 10*3/uL (ref 0.0–0.7)
Eosinophils Relative: 0 %
HCT: 43.7 % (ref 39.0–52.0)
HEMOGLOBIN: 15.5 g/dL (ref 13.0–17.0)
LYMPHS ABS: 3.7 10*3/uL (ref 0.7–4.0)
Lymphocytes Relative: 25 %
MCH: 30.2 pg (ref 26.0–34.0)
MCHC: 35.5 g/dL (ref 30.0–36.0)
MCV: 85 fL (ref 78.0–100.0)
Monocytes Absolute: 1.7 10*3/uL — ABNORMAL HIGH (ref 0.1–1.0)
Monocytes Relative: 12 %
Neutro Abs: 8.9 10*3/uL — ABNORMAL HIGH (ref 1.7–7.7)
Neutrophils Relative %: 62 %
Platelets: 272 10*3/uL (ref 150–400)
RBC: 5.14 MIL/uL (ref 4.22–5.81)
RDW: 13.3 % (ref 11.5–15.5)
WBC: 14.4 10*3/uL — AB (ref 4.0–10.5)

## 2017-09-15 MED ORDER — ONDANSETRON HCL 4 MG/2ML IJ SOLN
4.0000 mg | Freq: Once | INTRAMUSCULAR | Status: AC
  Administered 2017-09-15: 4 mg via INTRAVENOUS
  Filled 2017-09-15: qty 2

## 2017-09-15 MED ORDER — ALUM & MAG HYDROXIDE-SIMETH 400-400-40 MG/5ML PO SUSP: 15.0000 mL | Freq: Four times a day (QID) | ORAL | 0 refills | Status: DC | PRN

## 2017-09-15 MED ORDER — GI COCKTAIL ~~LOC~~
30.0000 mL | Freq: Once | ORAL | Status: AC
  Administered 2017-09-15: 30 mL via ORAL
  Filled 2017-09-15: qty 30

## 2017-09-15 MED ORDER — DICYCLOMINE HCL 10 MG PO CAPS
10.0000 mg | ORAL_CAPSULE | Freq: Once | ORAL | Status: AC
  Administered 2017-09-15: 10 mg via ORAL
  Filled 2017-09-15: qty 1

## 2017-09-15 MED ORDER — SODIUM CHLORIDE 0.9 % IV BOLUS (SEPSIS)
1000.0000 mL | Freq: Once | INTRAVENOUS | Status: AC
Start: 2017-09-15 — End: 2017-09-15
  Administered 2017-09-15: 1000 mL via INTRAVENOUS

## 2017-09-15 MED ORDER — FAMOTIDINE IN NACL 20-0.9 MG/50ML-% IV SOLN
20.0000 mg | Freq: Once | INTRAVENOUS | Status: AC
  Administered 2017-09-15: 20 mg via INTRAVENOUS
  Filled 2017-09-15: qty 50

## 2017-09-15 MED ORDER — DICYCLOMINE HCL 20 MG PO TABS: 20.0000 mg | ORAL_TABLET | Freq: Two times a day (BID) | ORAL | 0 refills | Status: DC

## 2017-09-15 MED ORDER — ONDANSETRON 4 MG PO TBDP: 4.0000 mg | ORAL_TABLET | Freq: Three times a day (TID) | ORAL | 0 refills | Status: AC | PRN

## 2017-09-15 NOTE — ED Triage Notes (Signed)
Pt states he stopped methadone 1 week ago-c/o abd pain and n/v-sent from [facility]-steady gait

## 2017-09-15 NOTE — ED Notes (Signed)
IV is positional, pt encouraged to keep arm straight to facilitate infusion of medication and NS bolus.

## 2017-09-15 NOTE — ED Provider Notes (Signed)
[facility] EMERGENCY DEPARTMENT Provider Note  CSN: [id number] Arrival date & time: 09/15/17 1132  Chief Complaint(s) Abdominal Pain  HPI Christian Mitchell is a 56 y.o. male with chronic pain on methadone that was incarcerated several days ago here for abd cramping.  The history is provided by the patient.  Abdominal Pain   This is a new problem. Episode onset: 5 days. Episode frequency: intermittent. The problem has not changed since onset.The pain is associated with an unknown factor. The pain is located in the epigastric region. The quality of the pain is cramping and colicky. Pain severity now: moderate to severe. Associated symptoms include diarrhea, nausea and vomiting. Pertinent negatives include fever, hematochezia and melena. Nothing aggravates the symptoms. Nothing relieves the symptoms.   Patient reports going through opiate withdrawal while incarcerated.  States that he got out several days ago and is tried to eat but eating worsens his pain.  Patient reports having some tuna yesterday.  Also had pineapple and coffee this morning.  He reports that the pain significantly worsened after the pineapple and coffee.  Patient denied any alcohol use or other recreational drugs.  Past Medical History Past Medical History:  Diagnosis Date  . Chronic back pain   . Chronic neck pain   . GERD (gastroesophageal reflux disease)   . IV drug abuse ([place])   . Kidney stones   . Opiate misuse   . Wears glasses   . Wears partial dentures    Patient Active Problem List   Diagnosis Date Noted  . Cervical radiculitis 02/16/2013  . Chronic neck pain 11/02/2012  . Right elbow pain 11/02/2012   Home Medication(s) Prior to Admission medications   Medication Sig Start Date End Date Taking? Authorizing Provider  alum & mag hydroxide-simeth (MAALOX ADVANCED MAX ST) 400-400-40 MG/5ML suspension Take 15 mLs by mouth every 6 (six) hours as needed for indigestion. 09/15/17   Fatima Blank, MD  dicyclomine (BENTYL) 20 MG tablet Take 1 tablet (20 mg total) by mouth 2 (two) times daily. 09/15/17   Fatima Blank, MD  ondansetron (ZOFRAN ODT) 4 MG disintegrating tablet Take 1 tablet (4 mg total) by mouth every 8 (eight) hours as needed for up to 3 days for nausea or vomiting. 09/15/17 09/18/17  Fatima Blank, MD                                                                                                                                    Past Surgical History Past Surgical History:  Procedure Laterality Date  . [place]   reattachement, left 1st - 4th  . minor laceration repair     Family History Family History  Problem Relation Age of Onset  . Arthritis Mother   . Diabetes Mother   . Cancer Father        prostate  . Arthritis Maternal Grandmother   . Heart  disease Maternal Grandmother   . Arthritis Paternal Grandmother   . Stroke Paternal Grandmother   . Diabetes Paternal Grandmother   . Cancer Paternal Grandfather        colon  . Hyperlipidemia Paternal Grandfather   . Hypertension Paternal Grandfather     Social History Social History   Tobacco Use  . Smoking status: Former Smoker    Packs/day: 1.00    Years: 25.00    Pack years: 25.00    Types: Cigarettes  . Smokeless tobacco: Never Used  Substance Use Topics  . Alcohol use: No    Frequency: Never  . Drug use: No   Allergies Patient has no known allergies.  Review of Systems Review of Systems  Constitutional: Negative for fever.  Gastrointestinal: Positive for abdominal pain, diarrhea, nausea and vomiting. Negative for hematochezia and melena.   All other systems are reviewed and are negative for acute change except as noted in the HPI  Physical Exam Vital Signs  I have reviewed the triage vital signs BP 123/87 (BP Location: Left Arm)   Pulse 96   Temp 97.9 F (36.6 C) (Oral)   Resp 20   Ht 6\' 1"  (1.854 m)   Wt 72.8 kg (160 lb 8 oz)   SpO2  100%   BMI 21.18 kg/m   Physical Exam  Constitutional: He is oriented to person, place, and time. He appears well-developed and well-nourished. No distress.  HENT:  Head: Normocephalic and atraumatic.  Nose: Nose normal.  Eyes: Conjunctivae and EOM are normal. Pupils are equal, round, and reactive to light. Right eye exhibits no discharge. Left eye exhibits no discharge. No scleral icterus.  Neck: Normal range of motion. Neck supple.  Cardiovascular: Normal rate and regular rhythm. Exam reveals no gallop and no friction rub.  No murmur heard. Pulmonary/Chest: Effort normal and breath sounds normal. No stridor. No respiratory distress. He has no rales.  Abdominal: Soft. He exhibits no distension. There is tenderness in the epigastric area and left upper quadrant.  Musculoskeletal: He exhibits no edema or tenderness.  Neurological: He is alert and oriented to person, place, and time.  Skin: Skin is warm and dry. No rash noted. He is not diaphoretic. No erythema.  Psychiatric: He has a normal mood and affect.  Vitals reviewed.   ED Results and Treatments Labs (all labs ordered are listed, but only abnormal results are displayed) Labs Reviewed  CBC WITH DIFFERENTIAL/PLATELET - Abnormal; Notable for the following components:      Result Value   WBC 14.4 (*)    Neutro Abs 8.9 (*)    Monocytes Absolute 1.7 (*)    All other components within normal limits  COMPREHENSIVE METABOLIC PANEL - Abnormal; Notable for the following components:   Potassium 3.4 (*)    Chloride 99 (*)    All other components within normal limits  LIPASE, BLOOD - Abnormal; Notable for the following components:   Lipase 82 (*)    All other components within normal limits  EKG  EKG Interpretation  Date/Time:    Ventricular Rate:    PR Interval:    QRS Duration:   QT Interval:    QTC  Calculation:   R Axis:     Text Interpretation:        Radiology No results found. Pertinent labs & imaging results that were available during my care of the patient were reviewed by me and considered in my medical decision making (see chart for details).  Medications Ordered in ED Medications  sodium chloride 0.9 % bolus 1,000 mL (0 mLs Intravenous Stopped 09/15/17 1423)  famotidine (PEPCID) IVPB 20 mg premix (0 mg Intravenous Stopped 09/15/17 1424)  gi cocktail (Maalox,Lidocaine,Donnatal) (30 mLs Oral Given 09/15/17 1244)  ondansetron (ZOFRAN) injection 4 mg (4 mg Intravenous Given 09/15/17 1244)  dicyclomine (BENTYL) capsule 10 mg (10 mg Oral Given 09/15/17 1426)                                                                                                                                    Procedures Procedures  (including critical care time)  Medical Decision Making / ED Course I have reviewed the nursing notes for this encounter and the patient's prior records (if available in EHR or on provided paperwork).    Intermittent abdominal cramping with associated nausea and vomiting in the setting of opiate withdrawal.  Abdomen with mild epigastric and left upper quadrant discomfort but no evidence of peritonitis.  Labs did reveal leukocytosis which are nonspecific and could be demargination from recent withdrawal.  No significant electrolyte derangements or renal insufficiency.  Lipase mildly elevated, but not consistent with acute pancreatitis.   Patient is declining any narcotic pain medication.  Is requesting relief for nausea and vomiting in the cramping.  He was provided with IV fluids, GI cocktail, Pepcid, and Bentyl which provided significant improvement in his symptomatology.  He was able to tolerate oral intake.  Repeat abdominal exam benign.  Low suspicion for serious intra-abdominal inflammatory/infectious process or small bowel obstruction.  No indication for advanced  imaging at this time.  Patient was given strict return precautions.  The patient appears reasonably screened and/or stabilized for discharge and I doubt any other medical condition or other [facility] requiring further screening, evaluation, or treatment in the ED at this time prior to discharge.   Final Clinical Impression(s) / ED Diagnoses Final diagnoses:  Abdominal cramping  Non-intractable cyclical vomiting with nausea    Disposition: Discharge  Condition: Good  I have discussed the results, Dx and Tx plan with the patient who expressed understanding and agree(s) with the plan. Discharge instructions discussed at great length. The patient was given strict return precautions who verbalized understanding of the instructions. No further questions at time of discharge.    ED Discharge Orders        Ordered    ondansetron (ZOFRAN ODT) 4 MG disintegrating tablet  Every 8 hours PRN  09/15/17 1452    dicyclomine (BENTYL) 20 MG tablet  2 times daily     09/15/17 1452    alum & mag hydroxide-simeth (MAALOX ADVANCED MAX ST) 630-160-10 MG/5ML suspension  Every 6 hours PRN     09/15/17 1452       Follow Up: Delilah Shan, MD [address] [facility] [place] [address] [phone]  Schedule an appointment as soon as possible for a visit  As needed     This chart was dictated using voice recognition software.  Despite best efforts to proofread,  errors can occur which can change the documentation meaning.   Fatima Blank, MD 09/15/17 1452

## 2017-11-29 ENCOUNTER — Emergency Department (HOSPITAL_BASED_OUTPATIENT_CLINIC_OR_DEPARTMENT_OTHER): Payer: 59

## 2017-11-29 ENCOUNTER — Encounter (HOSPITAL_BASED_OUTPATIENT_CLINIC_OR_DEPARTMENT_OTHER): Payer: Self-pay | Admitting: *Deleted

## 2017-11-29 ENCOUNTER — Other Ambulatory Visit: Payer: Self-pay

## 2017-11-29 ENCOUNTER — Emergency Department (HOSPITAL_BASED_OUTPATIENT_CLINIC_OR_DEPARTMENT_OTHER)
Admission: EM | Admit: 2017-11-29 | Discharge: 2017-11-29 | Disposition: A | Discharge status: Home / Self Care | Payer: 59 | Attending: Physician Assistant | Admitting: Physician Assistant

## 2017-11-29 DIAGNOSIS — Z87891 Personal history of nicotine dependence: Secondary | ICD-10-CM | POA: Diagnosis not present

## 2017-11-29 DIAGNOSIS — R002 Palpitations: Secondary | ICD-10-CM | POA: Diagnosis not present

## 2017-11-29 NOTE — Discharge Instructions (Signed)
Please follow-up with your primary care physician on Monday and get scheduled for Holter monitor.

## 2017-11-29 NOTE — ED Triage Notes (Signed)
Pt reports that he feels like his heart is racing x several hours. Denies SOB, chest pain.  Denies N/V. Reports slight dizziness. Ambulatory-drove himself to the ED today.

## 2017-11-29 NOTE — ED Provider Notes (Signed)
[facility] EMERGENCY DEPARTMENT Provider Note   CSN: [id number] Arrival date & time: 11/29/17  1646     History   Chief Complaint Chief Complaint  Patient presents with  . Tachycardia    HPI Acheron Sugg is a 57 y.o. male.  HPI  Patient is a 57 year old male presenting with palpitations.  Patient has no chest pain.  Patient's had palpitations one other time, 2 months ago.  They had feeling of palpitations, irregular heart rate lasted less than 45 minutes.  Self Resolved.  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  No diabetes.  Patient denies any chest pain.  No shortness of breath.  No recent travel.  No leg swelling.  No recent immobility.  Past Medical History:  Diagnosis Date  . Chronic back pain   . Chronic neck pain   . GERD (gastroesophageal reflux disease)   . IV drug abuse ([place])   . Kidney stones   . Opiate misuse   . Wears glasses   . Wears partial dentures     Patient Active Problem List   Diagnosis Date Noted  . Cervical radiculitis 02/16/2013  . Chronic neck pain 11/02/2012  . Right elbow pain 11/02/2012    Past Surgical History:  Procedure Laterality Date  . [place]   reattachement, left 1st - 4th  . minor laceration repair         Home Medications    Prior to Admission medications   Not on File    Family History Family History  Problem Relation Age of Onset  . Arthritis Mother   . Diabetes Mother   . Cancer Father        prostate  . Arthritis Maternal Grandmother   . Heart disease Maternal Grandmother   . Arthritis Paternal Grandmother   . Stroke Paternal Grandmother   . Diabetes Paternal Grandmother   . Cancer Paternal Grandfather        colon  . Hyperlipidemia Paternal Grandfather   . Hypertension Paternal Grandfather     Social History Social History   Tobacco Use  . Smoking status: Former Smoker    Packs/day: 1.00    Years: 25.00    Pack  years: 25.00    Types: Cigarettes  . Smokeless tobacco: Never Used  Substance Use Topics  . Alcohol use: No    Frequency: Never  . Drug use: No    Comment: last used 2017     Allergies   Patient has no known allergies.   Review of Systems Review of Systems  Constitutional: Negative for activity change, fatigue and fever.  Respiratory: Negative for shortness of breath.   Cardiovascular: Positive for palpitations. Negative for chest pain.  Gastrointestinal: Negative for abdominal pain.  All other systems reviewed and are negative.    Physical Exam Updated Vital Signs BP 140/86 (BP Location: Right Arm)   Pulse 78   Temp 97.7 F (36.5 C) (Oral)   Resp 16   Ht 6' (1.829 m)   Wt 74.8 kg (165 lb)   SpO2 100%   BMI 22.38 kg/m   Physical Exam  Constitutional: He is oriented to person, place, and time. He appears well-nourished.  HENT:  Head: Normocephalic.  Eyes: Conjunctivae are normal.  Cardiovascular: Normal rate and regular rhythm.  No murmur heard. Pulmonary/Chest: Effort normal and breath sounds normal. No respiratory distress.  Musculoskeletal: He exhibits no edema.  Neurological: He is oriented to person,  place, and time.  Skin: Skin is warm and dry. He is not diaphoretic.  Psychiatric: He has a normal mood and affect. His behavior is normal.     ED Treatments / Results  Labs (all labs ordered are listed, but only abnormal results are displayed) Labs Reviewed - No data to display  EKG  EKG Interpretation  Date/Time:  Saturday November 29 2017 16:54:53 EST Ventricular Rate:  92 PR Interval:  160 QRS Duration: 110 QT Interval:  358 QTC Calculation: 442 R Axis:   78 Text Interpretation:  Normal sinus rhythm Normal ECG No significant change since last tracing Confirmed by Zenovia Jarred [phone]) on 11/29/2017 6:28:57 PM       Radiology Dg Chest 2 View  Result Date: 11/29/2017 CLINICAL DATA:  irreg heart rate today, and 1 wk ago for several hours.  Dizziness. Ex smoker. No htn or diab. Asthmatic. EXAM: CHEST  2 VIEW COMPARISON:  05/21/2014 FINDINGS: The heart size and mediastinal contours are within normal limits. Both lungs are clear. No pleural effusion or pneumothorax. The visualized skeletal structures are unremarkable. IMPRESSION: No active cardiopulmonary disease. Electronically Signed   By: Lajean Manes M.D.   On: 11/29/2017 17:38    Procedures Procedures (including critical care time)  Medications Ordered in ED Medications - No data to display   Initial Impression / Assessment and Plan / ED Course  I have reviewed the triage vital signs and the nursing notes.  Pertinent labs & imaging results that were available during my care of the patient were reviewed by me and considered in my medical decision making (see chart for details).    Patient is a 57 year old male presenting with palpitations.  Patient has no chest pain.  Patient's had palpitations one other time, 2 months ago.  They had feeling of palpitations, irregular heart rate lasted less than 45 minutes.  Self Resolved.  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  No diabetes.  Patient denies any chest pain.  No shortness of breath.  No recent travel.  No leg swelling.  No recent immobility.  7:15 PM Patient had palpitations.  No chest pains, therefore I do not think there is any reason to do blood work.  Patient said he felt irregular, I suspect that he could have gone into A. fib for short period of time.  Patient will need a Holter monitor.  Patient has a primary care and he promises he will call on Monday.   Final Clinical Impressions(s) / ED Diagnoses   Final diagnoses:  None    ED Discharge Orders    None       Macarthur Critchley, MD 11/29/17 1916

## 2018-06-25 ENCOUNTER — Emergency Department (HOSPITAL_BASED_OUTPATIENT_CLINIC_OR_DEPARTMENT_OTHER): Payer: 59

## 2018-06-25 ENCOUNTER — Other Ambulatory Visit: Payer: Self-pay

## 2018-06-25 ENCOUNTER — Encounter (HOSPITAL_BASED_OUTPATIENT_CLINIC_OR_DEPARTMENT_OTHER): Payer: Self-pay

## 2018-06-25 DIAGNOSIS — R0789 Other chest pain: Secondary | ICD-10-CM | POA: Insufficient documentation

## 2018-06-25 DIAGNOSIS — R0602 Shortness of breath: Secondary | ICD-10-CM | POA: Insufficient documentation

## 2018-06-25 DIAGNOSIS — Z5321 Procedure and treatment not carried out due to patient leaving prior to being seen by health care provider: Secondary | ICD-10-CM | POA: Insufficient documentation

## 2018-06-25 LAB — CBC
HEMATOCRIT: 41.2 % (ref 39.0–52.0)
HEMOGLOBIN: 14.1 g/dL (ref 13.0–17.0)
MCH: 31.5 pg (ref 26.0–34.0)
MCHC: 34.2 g/dL (ref 30.0–36.0)
MCV: 92 fL (ref 78.0–100.0)
Platelets: 207 10*3/uL (ref 150–400)
RBC: 4.48 MIL/uL (ref 4.22–5.81)
RDW: 14.2 % (ref 11.5–15.5)
WBC: 8.3 10*3/uL (ref 4.0–10.5)

## 2018-06-25 LAB — BASIC METABOLIC PANEL
Anion gap: 10 (ref 5–15)
BUN: 17 mg/dL (ref 6–20)
CHLORIDE: 103 mmol/L (ref 98–111)
CO2: 29 mmol/L (ref 22–32)
Calcium: 9.2 mg/dL (ref 8.9–10.3)
Creatinine, Ser: 0.95 mg/dL (ref 0.61–1.24)
GFR calc non Af Amer: >60 mL/min (ref >60)
Glucose, Bld: 79 mg/dL (ref 70–99)
POTASSIUM: 3.6 mmol/L (ref 3.5–5.1)
Sodium: 142 mmol/L (ref 135–145)

## 2018-06-25 LAB — TROPONIN I: Troponin I: <0.03 ng/mL (ref <0.03)

## 2018-06-25 NOTE — ED Triage Notes (Signed)
Pt reports chest pressure and sob that started at 2130 today. Pt non-diaphoretic, regular RR. Pt A+OX4, NAD.

## 2018-06-25 NOTE — ED Notes (Signed)
Patient transported to X-ray 

## 2018-06-26 ENCOUNTER — Emergency Department (HOSPITAL_BASED_OUTPATIENT_CLINIC_OR_DEPARTMENT_OTHER)
Admission: EM | Admit: 2018-06-26 | Discharge: 2018-06-26 | Disposition: A | Discharge status: Home / Self Care | Payer: 59 | Attending: Emergency Medicine | Admitting: Emergency Medicine

## 2019-01-06 ENCOUNTER — Encounter (HOSPITAL_BASED_OUTPATIENT_CLINIC_OR_DEPARTMENT_OTHER): Payer: Self-pay

## 2019-01-06 ENCOUNTER — Other Ambulatory Visit: Payer: Self-pay

## 2019-01-06 ENCOUNTER — Emergency Department (HOSPITAL_BASED_OUTPATIENT_CLINIC_OR_DEPARTMENT_OTHER)
Admission: EM | Admit: 2019-01-06 | Discharge: 2019-01-07 | Disposition: A | Discharge status: Home / Self Care | Payer: 59 | Attending: Emergency Medicine | Admitting: Emergency Medicine

## 2019-01-06 ENCOUNTER — Emergency Department (HOSPITAL_BASED_OUTPATIENT_CLINIC_OR_DEPARTMENT_OTHER): Payer: 59

## 2019-01-06 DIAGNOSIS — I4891 Unspecified atrial fibrillation: Secondary | ICD-10-CM | POA: Insufficient documentation

## 2019-01-06 DIAGNOSIS — R002 Palpitations: Secondary | ICD-10-CM | POA: Diagnosis present

## 2019-01-06 DIAGNOSIS — Z87891 Personal history of nicotine dependence: Secondary | ICD-10-CM | POA: Insufficient documentation

## 2019-01-06 DIAGNOSIS — J45909 Unspecified asthma, uncomplicated: Secondary | ICD-10-CM | POA: Diagnosis not present

## 2019-01-06 HISTORY — DX: Unspecified viral hepatitis C without hepatic coma: B19.20

## 2019-01-06 HISTORY — DX: Unspecified asthma, uncomplicated: J45.909

## 2019-01-06 LAB — COMPREHENSIVE METABOLIC PANEL
ALT: 51 U/L — ABNORMAL HIGH (ref 0–44)
AST: 57 U/L — ABNORMAL HIGH (ref 15–41)
Albumin: 4.5 g/dL (ref 3.5–5.0)
Alkaline Phosphatase: 93 U/L (ref 38–126)
Anion gap: 8 (ref 5–15)
BUN: 17 mg/dL (ref 6–20)
CALCIUM: 9.3 mg/dL (ref 8.9–10.3)
CO2: 26 mmol/L (ref 22–32)
Chloride: 104 mmol/L (ref 98–111)
Creatinine, Ser: 0.98 mg/dL (ref 0.61–1.24)
GFR calc Af Amer: >60 mL/min (ref >60)
Glucose, Bld: 59 mg/dL — ABNORMAL LOW (ref 70–99)
Potassium: 3.7 mmol/L (ref 3.5–5.1)
SODIUM: 138 mmol/L (ref 135–145)
TOTAL PROTEIN: 7.5 g/dL (ref 6.5–8.1)
Total Bilirubin: 0.9 mg/dL (ref 0.3–1.2)

## 2019-01-06 LAB — CBC WITH DIFFERENTIAL/PLATELET
Abs Immature Granulocytes: 0.03 10*3/uL (ref 0.00–0.07)
BASOS ABS: 0.1 10*3/uL (ref 0.0–0.1)
BASOS PCT: 1 %
EOS ABS: 0.3 10*3/uL (ref 0.0–0.5)
Eosinophils Relative: 3 %
HCT: 44.1 % (ref 39.0–52.0)
Hemoglobin: 14.5 g/dL (ref 13.0–17.0)
IMMATURE GRANULOCYTES: 0 %
Lymphocytes Relative: 22 %
Lymphs Abs: 2.7 10*3/uL (ref 0.7–4.0)
MCH: 28.9 pg (ref 26.0–34.0)
MCHC: 32.9 g/dL (ref 30.0–36.0)
MCV: 88 fL (ref 80.0–100.0)
Monocytes Absolute: 1 10*3/uL (ref 0.1–1.0)
Monocytes Relative: 8 %
NEUTROS ABS: 7.9 10*3/uL — AB (ref 1.7–7.7)
NEUTROS PCT: 66 %
NRBC: 0 % (ref 0.0–0.2)
PLATELETS: 219 10*3/uL (ref 150–400)
RBC: 5.01 MIL/uL (ref 4.22–5.81)
RDW: 13.4 % (ref 11.5–15.5)
WBC: 12 10*3/uL — AB (ref 4.0–10.5)

## 2019-01-06 LAB — ETHANOL: Alcohol, Ethyl (B): <10 mg/dL (ref <10)

## 2019-01-06 LAB — RAPID URINE DRUG SCREEN, HOSP PERFORMED
Amphetamines: NOT DETECTED
Barbiturates: NOT DETECTED
Benzodiazepines: NOT DETECTED
COCAINE: NOT DETECTED
OPIATES: NOT DETECTED
TETRAHYDROCANNABINOL: NOT DETECTED

## 2019-01-06 LAB — TROPONIN I: Troponin I: <0.03 ng/mL (ref <0.03)

## 2019-01-06 MED ORDER — DILTIAZEM LOAD VIA INFUSION
10.0000 mg | Freq: Once | INTRAVENOUS | Status: AC
  Administered 2019-01-06: 10 mg via INTRAVENOUS
  Filled 2019-01-06: qty 10

## 2019-01-06 MED ORDER — DILTIAZEM HCL 30 MG PO TABS: 30.0000 mg | ORAL_TABLET | Freq: Four times a day (QID) | ORAL | 0 refills | Status: DC

## 2019-01-06 MED ORDER — RIVAROXABAN 20 MG PO TABS: 20.0000 mg | ORAL_TABLET | Freq: Every day | ORAL | 0 refills | Status: DC

## 2019-01-06 MED ORDER — RIVAROXABAN 20 MG PO TABS
20.0000 mg | ORAL_TABLET | Freq: Once | ORAL | Status: AC
  Administered 2019-01-06: 20 mg via ORAL
  Filled 2019-01-06: qty 1

## 2019-01-06 MED ORDER — DILTIAZEM HCL 30 MG PO TABS
30.0000 mg | ORAL_TABLET | Freq: Once | ORAL | Status: AC
  Administered 2019-01-06: 30 mg via ORAL
  Filled 2019-01-06: qty 1

## 2019-01-06 MED ORDER — DILTIAZEM HCL 100 MG IV SOLR
INTRAVENOUS | Status: AC
  Filled 2019-01-06: qty 100

## 2019-01-06 MED ORDER — DILTIAZEM HCL 100 MG IV SOLR
5.0000 mg/h | INTRAVENOUS | Status: DC
  Administered 2019-01-06: 5 mg/h via INTRAVENOUS

## 2019-01-06 MED ORDER — SODIUM CHLORIDE 0.9 % IV BOLUS
1000.0000 mL | Freq: Once | INTRAVENOUS | Status: AC
  Administered 2019-01-06: 1000 mL via INTRAVENOUS

## 2019-01-06 NOTE — ED Provider Notes (Signed)
Emergency Department Provider Note   I have reviewed the triage vital signs and the nursing notes.   HISTORY  Chief Complaint Palpitations   HPI Christian Mitchell is a 58 y.o. male with PMH of asthma, Hep C, and chronic pain presents to the ED with heart palpitations.  Patient has had persistent symptoms over the past 45 minutes.  He states that he goes in and out of these similar palpitations frequently.  He finds it if he drinks liquor he frequently gets them although tonight he has not been drinking liquor.  Denies any chest pain, shortness of breath, lightheadedness.  Symptoms began suddenly and when they did not stop, as they normally do, he presented to the emergency department at the insistence of his wife.  With prior episodes he has come to the emergency department but left prior to evaluation.  He has not followed up with a primary care doctor or cardiologist regarding the symptoms.  He is not on anticoagulation.    Past Medical History:  Diagnosis Date  . Asthma   . Chronic back pain   . Chronic neck pain   . GERD (gastroesophageal reflux disease)   . Hepatitis C   . IV drug abuse ([place])   . Kidney stones   . Opiate misuse   . Wears glasses   . Wears partial dentures     Patient Active Problem List   Diagnosis Date Noted  . Cervical radiculitis 02/16/2013  . Chronic neck pain 11/02/2012  . Right elbow pain 11/02/2012    Past Surgical History:  Procedure Laterality Date  . [place]   reattachement, left 1st - 4th  . minor laceration repair      Allergies Patient has no known allergies.  Family History  Problem Relation Age of Onset  . Arthritis Mother   . Diabetes Mother   . Cancer Father        prostate  . Arthritis Maternal Grandmother   . Heart disease Maternal Grandmother   . Arthritis Paternal Grandmother   . Stroke Paternal Grandmother   . Diabetes Paternal Grandmother   . Cancer Paternal Grandfather        colon  .  Hyperlipidemia Paternal Grandfather   . Hypertension Paternal Grandfather     Social History Social History   Tobacco Use  . Smoking status: Former Smoker    Packs/day: 1.00    Years: 25.00    Pack years: 25.00    Types: Cigarettes  . Smokeless tobacco: Never Used  Substance Use Topics  . Alcohol use: No    Frequency: Never  . Drug use: Yes    Types: IV    Review of Systems  Constitutional: No fever/chills Eyes: No visual changes. ENT: No sore throat. Cardiovascular: Denies chest pain. Respiratory: Denies shortness of breath. Gastrointestinal: No abdominal pain.  No nausea, no vomiting.  No diarrhea.  No constipation. Genitourinary: Negative for dysuria. Musculoskeletal: Negative for back pain. Skin: Negative for rash. Neurological: Negative for headaches, focal weakness or numbness.  10-point ROS otherwise negative.  ____________________________________________   PHYSICAL EXAM:  VITAL SIGNS: ED Triage Vitals  Enc Vitals Group     BP 01/06/19 2227 (!) 141/98     Pulse Rate 01/06/19 2227 (!) 165     Resp 01/06/19 2227 19     Temp 01/06/19 2227 98.7 F (37.1 C)     Temp Source 01/06/19 2227 Oral     SpO2 01/06/19 2227 100 %  Weight 01/06/19 2225 160 lb (72.6 kg)     Height 01/06/19 2225 6\' 1"  (1.854 m)     Pain Score 01/06/19 2224 2   Constitutional: Alert and oriented. Well appearing and in no acute distress. Eyes: Conjunctivae are normal.  Head: Atraumatic. Nose: No congestion/rhinnorhea. Mouth/Throat: Mucous membranes are moist.  Neck: No stridor.   Cardiovascular: A-fib with RVR. Good peripheral circulation. Grossly normal heart sounds.   Respiratory: Normal respiratory effort.  No retractions. Lungs CTAB. Gastrointestinal: Soft and nontender. No distention.  Musculoskeletal: No lower extremity tenderness nor edema. No gross deformities of extremities. Neurologic:  Normal speech and language. No gross focal neurologic deficits are appreciated.   Skin:  Skin is warm, dry and intact. No rash noted.  ____________________________________________   LABS (all labs ordered are listed, but only abnormal results are displayed)  Labs Reviewed  CBC WITH DIFFERENTIAL/PLATELET - Abnormal; Notable for the following components:      Result Value   WBC 12.0 (*)    Neutro Abs 7.9 (*)    All other components within normal limits  COMPREHENSIVE METABOLIC PANEL - Abnormal; Notable for the following components:   Glucose, Bld 59 (*)    AST 57 (*)    ALT 51 (*)    All other components within normal limits  TROPONIN I  RAPID URINE DRUG SCREEN, HOSP PERFORMED  ETHANOL  TSH   ____________________________________________  EKG   EKG Interpretation  Date/Time:  Wednesday January 06 2019 22:29:29 EDT Ventricular Rate:  169 PR Interval:    QRS Duration: 103 QT Interval:  275 QTC Calculation: 462 R Axis:   80 Text Interpretation:  Atrial fibrillation with rapid V-rate Paired ventricular premature complexes Repolarization abnormality, prob rate related No STEMI.  Confirmed by Nanda Quinton [id number]) on 01/06/2019 10:45:38 PM       ____________________________________________  RADIOLOGY  Dg Chest 2 View  Result Date: 01/06/2019 CLINICAL DATA:  Heart palpitations EXAM: CHEST - 2 VIEW COMPARISON:  06/25/2018 FINDINGS: Heart and mediastinal contours are within normal limits. No focal opacities or effusions. No acute bony abnormality. IMPRESSION: No active cardiopulmonary disease. Electronically Signed   By: Rolm Baptise M.D.   On: 01/06/2019 22:58    ____________________________________________   PROCEDURES  Procedure(s) performed:   Procedures  CRITICAL CARE Performed by: Margette Fast Total critical care time: 35 minutes Critical care time was exclusive of separately billable procedures and treating other patients. Critical care was necessary to treat or prevent imminent or life-threatening deterioration. Critical care was time  spent personally by me on the following activities: development of treatment plan with patient and/or surrogate as well as nursing, discussions with consultants, evaluation of patient's response to treatment, examination of patient, obtaining history from patient or surrogate, ordering and performing treatments and interventions, ordering and review of laboratory studies, ordering and review of radiographic studies, pulse oximetry and re-evaluation of patient's condition.  Nanda Quinton, MD Emergency Medicine  ____________________________________________   INITIAL IMPRESSION / ASSESSMENT AND PLAN / ED COURSE  Pertinent labs & imaging results that were available during my care of the patient were reviewed by me and considered in my medical decision making (see chart for details).  Patient presents to the ED with A-fib with RVR. Normal BP. Awake and alert. Plan for rate control. Patient with multiple episodes in the past and not anticoagulated.   10:50 PM  Patient converted to sinus tachycardia with diltiazem infusion. Will stop infusion, give PO anticoagulation, and Diltiazem PO for sustained rate  control.   11:20 PM  Patient is feeling much better. Stopped Diltiazem infusion and gave PO diltiazem.   This patients CHA2DS2-VASc Score and unadjusted Ischemic Stroke Rate (% per year) is equal to 0.2 % stroke rate/year from a score of 0  Above score calculated as 1 point each if present [CHF, HTN, DM, Vascular=MI/PAD/Aortic Plaque, Age if 65-74, or Male] Above score calculated as 2 points each if present [Age > 75, or Stroke/TIA/TE]  Monitor in the ED for 2 hours. Care transferred to Dr. Randal Buba. Placed referral for a-fib clinic.  ____________________________________________  FINAL CLINICAL IMPRESSION(S) / ED DIAGNOSES  Final diagnoses:  Atrial fibrillation with rapid ventricular response ([place])     MEDICATIONS GIVEN DURING THIS VISIT:  Medications  diltiazem (CARDIZEM) 100 MG  injection (has no administration in time range)  sodium chloride 0.9 % bolus 1,000 mL (1,000 mLs Intravenous New Bag/Given 01/06/19 2257)  diltiazem (CARDIZEM) 1 mg/mL load via infusion 10 mg (10 mg Intravenous Bolus from Bag 01/06/19 2253)  rivaroxaban (XARELTO) tablet 20 mg (20 mg Oral Given 01/06/19 2319)  diltiazem (CARDIZEM) tablet 30 mg (30 mg Oral Given 01/06/19 2319)     NEW OUTPATIENT MEDICATIONS STARTED DURING THIS VISIT:  New Prescriptions   DILTIAZEM (CARDIZEM) 30 MG TABLET    Take 1 tablet (30 mg total) by mouth 4 (four) times daily for 30 days.   RIVAROXABAN (XARELTO) 20 MG TABS TABLET    Take 1 tablet (20 mg total) by mouth daily with supper.    Note:  This document was prepared using Dragon voice recognition software and may include unintentional dictation errors.  Nanda Quinton, MD Emergency Medicine    Long, Wonda [facility], MD 01/07/19 [phone]

## 2019-01-06 NOTE — Discharge Instructions (Signed)
You were seen in the emergency department today with atrial fibrillation.  You got out of this rhythm after getting some medication here.  We were able to give you pills to keep your heart rate down.  He will need to start on a blood thinner called Xarelto.  This makes it more likely that she can have bleeding if you are injured, fall.  You will need to seek medical attention with any serious head injury or if you see blood in the bowel movements.  I have placed a referral to the cardiology clinic.  They will be in touch to schedule an appointment.  You should call them to confirm.  Take your Xarelto and ties them as directed.  Return to the emergency department with any new or worsening symptoms.

## 2019-01-06 NOTE — ED Notes (Signed)
Patient states no fluttering feelings in chest currently. Denies any chest pain at this time.

## 2019-01-06 NOTE — ED Triage Notes (Signed)
Pt c/o palpitations and his "heart skipping a beat" for the last 45 minutes. He c/o chest aching, and some vague dizziness. This has happened before, but pt never followed up bc he was "busy."

## 2019-01-06 NOTE — ED Notes (Signed)
Pt on monitor 

## 2019-01-07 LAB — TSH: TSH: 1.58 u[IU]/mL (ref 0.350–4.500)

## 2019-01-07 MED FILL — dilTIAZem HCL 30 MG TABS: 30 | 30 days supply | Qty: 120 | Fill #0

## 2019-01-07 MED FILL — XARELTO 20 MG TABLET: 20 | 30 days supply | Qty: 30 | Fill #0

## 2019-01-07 MED FILL — VENTOLIN HFA 90 MCG INHALER: 108 (90 BAS | 50 days supply | Qty: 18 | Fill #0

## 2019-01-18 ENCOUNTER — Telehealth (HOSPITAL_COMMUNITY): Payer: Self-pay | Admitting: *Deleted

## 2019-01-18 NOTE — Telephone Encounter (Signed)
Pt already set up for virtual visit and has gotten and started medications

## 2019-01-20 ENCOUNTER — Encounter (HOSPITAL_COMMUNITY): Payer: Self-pay

## 2019-01-20 ENCOUNTER — Other Ambulatory Visit: Payer: Self-pay

## 2019-01-20 ENCOUNTER — Ambulatory Visit (HOSPITAL_COMMUNITY)
Admission: RE | Admit: 2019-01-20 | Discharge: 2019-01-20 | Disposition: A | Discharge status: Home / Self Care | Payer: 59 | Source: Ambulatory Visit | Attending: Physician Assistant | Admitting: Physician Assistant

## 2019-01-20 ENCOUNTER — Telehealth (HOSPITAL_COMMUNITY): Payer: Self-pay | Admitting: *Deleted

## 2019-01-20 ENCOUNTER — Other Ambulatory Visit (HOSPITAL_COMMUNITY): Payer: Self-pay | Admitting: *Deleted

## 2019-01-20 DIAGNOSIS — I480 Paroxysmal atrial fibrillation: Secondary | ICD-10-CM

## 2019-01-20 DIAGNOSIS — R0683 Snoring: Secondary | ICD-10-CM

## 2019-01-20 MED ORDER — DILTIAZEM HCL 30 MG PO TABS: ORAL_TABLET | ORAL | 0 refills | Status: DC

## 2019-01-20 NOTE — Progress Notes (Signed)
Electrophysiology TeleHealth Note   Due to national recommendations of social distancing due to [place] 19, Audio/video telehealth visit is felt to be most appropriate for this patient at this time.  See MyChart message from today for patient consent regarding telehealth for the Atrial Fibrillation Clinic.    Date:  01/20/2019   ID:  Christian Mitchell, DOB [birth year], MRN [id number]  Location: work Provider location: [address] [place], [place] [address] Evaluation Performed: New patient consult  PCP:  Delilah Shan, MD   CC: Consultation for atrial fibrillation   History of Present Illness: Christian Mitchell is a 58 y.o. male who presents via audio/video conferencing for a telehealth visit today.   The patient is referred for new consultation regarding atrial fibrilaltion by [name]. Patient has a history of asthma, Hep C, and chronic pain, alcohol use. He presented to the ER with heart racing and palpitations on 01/06/19 and was found to be in afib with RVR. Patient was given IV diltiazem and he converted to sinus tachycardia. In hindsight, patient has had palpitations for several years which occur about once per month and las 5-10 minutes. This episode lasted for about an hour prior to presenting to the ER. He was given diltiazem and Xarelto at discharge. He does report that he drinks about 3 drinks per day (some liquor with breakfast and then 2 drinks in the evening) as well as severe snoring and daytime somnolence.   Today, he denies symptoms of chest pain, shortness of breath, orthopnea, PND, lower extremity edema, claudication, dizziness, presyncope, syncope, bleeding, or neurologic sequela. The patient is tolerating medications without difficulties and is otherwise without complaint today.   he denies symptoms of cough, fevers, chills, or new SOB worrisome for COVID 19.     Atrial Fibrillation Risk Factors:  he does have symptoms of sleep apnea. he does not have a  history of rheumatic fever. he does have a history of alcohol use. The patient does not have a history of early familial atrial fibrillation or other arrhythmias.  he has a BMI of There is no height or weight on file to calculate BMI.. There were no vitals filed for this visit.  Past Medical History:  Diagnosis Date  . Asthma   . Chronic back pain   . Chronic neck pain   . GERD (gastroesophageal reflux disease)   . Hepatitis C   . IV drug abuse ([place])   . Kidney stones   . Opiate misuse   . Wears glasses   . Wears partial dentures    Past Surgical History:  Procedure Laterality Date  . [place]   reattachement, left 1st - 4th  . minor laceration repair       Current Outpatient Medications  Medication Sig Dispense Refill  . albuterol (PROVENTIL HFA;VENTOLIN HFA) 108 (90 Base) MCG/ACT inhaler INL 1 PUFF PO QID PRN    . diltiazem (CARDIZEM) 30 MG tablet Take 1 tablet (30 mg total) by mouth 4 (four) times daily for 30 days. 120 tablet 0  . meloxicam (MOBIC) 15 MG tablet TK 1 T PO QD PRN    . omeprazole (PRILOSEC) 40 MG capsule Take by mouth.    . rivaroxaban (XARELTO) 20 MG TABS tablet Take 1 tablet (20 mg total) by mouth daily with supper. 30 tablet 0  . tamsulosin (FLOMAX) 0.4 MG CAPS capsule Take by mouth.     No current facility-administered medications for this encounter.  Allergies:   Patient has no known allergies.   Social History:  The patient  reports that he has quit smoking. His smoking use included cigarettes. He has a 25.00 pack-year smoking history. He has never used smokeless tobacco. He reports current drug use. Drug: IV. He reports that he does not drink alcohol.   Family History:  The patient's  family history includes Arthritis in his maternal grandmother, mother, and paternal grandmother; Cancer in his father and paternal grandfather; Diabetes in his mother and paternal grandmother; Heart disease in his maternal grandmother; Hyperlipidemia in  his paternal grandfather; Hypertension in his paternal grandfather; Stroke in his paternal grandmother.    ROS:  Please see the history of present illness.   All other systems are personally reviewed and negative.   Exam: Well appearing, alert and conversant, regular work of breathing,  good skin color  Recent Labs: 01/06/2019: ALT 51; BUN 17; Creatinine, Ser 0.98; Hemoglobin 14.5; Platelets 219; Potassium 3.7; Sodium 138; TSH 1.580  personally reviewed    Other studies personally reviewed: Additional studies/ records that were reviewed today include: Epic notes    ASSESSMENT AND PLAN:  1.  Paroxysmal atrial fibrillation Patient has newly diagnosed paroxysmal atrial fibrillation. General education about afib provided. Will change diltiazem 30 mg to PRN q4hrs for heart racing We discussed his stroke risk as well as the risks and benefits of anticoagulation. With a CHADS2VASC score of 0, per guidelines patient should not be on anticoagulation. Patient voices understanding and is in agreement. Stop Xarelto Normal TSH, K+ at ER noted Will consider echocardiogram once COVID-19 precautions end. We discussed lifestyle modifications including reducing alcohol intake to <2 drinks per week.  This patients CHA2DS2-VASc Score and unadjusted Ischemic Stroke Rate (% per year) is equal to 0.2 % stroke rate/year from a score of 0  Above score calculated as 1 point each if present [CHF, HTN, DM, Vascular=MI/PAD/Aortic Plaque, Age if 65-74, or Male] Above score calculated as 2 points each if present [Age > 75, or Stroke/TIA/TE]  2. Severe snoring/daytime somnolence Patient admits to severe snoring. Will arrange for sleep study. We discussed the importance of treating sleep apnea for maintaining SR in the long-term.   COVID screen The patient does not have any symptoms that suggest any further testing/ screening at this time.  Social distancing reinforced today.    Follow-up with the Afib  clinic for telehealth visit in one month.  Current medicines are reviewed at length with the patient today.   The patient does not have concerns regarding his medicines.  The following changes were made today:  Stop Xarelto, change diltiazem to PRN.  Labs/ tests ordered today include:  No orders of the defined types were placed in this encounter.   Patient Risk:  after full review of this patients clinical status, I feel that they are at moderate risk at this time.   Today, I have spent 22 minutes with the patient with telehealth technology discussing atrial fibrillation, alcohol cessation, sleep apnea, and medications.    Gwenlyn Perking PA-C 01/20/2019 2:45 PM  Af[facility] [address] [place], [place] [address] [phone]

## 2019-01-20 NOTE — Telephone Encounter (Signed)
.  mychart

## 2019-01-22 ENCOUNTER — Encounter (HOSPITAL_COMMUNITY): Payer: Self-pay | Admitting: *Deleted

## 2019-02-14 ENCOUNTER — Encounter (HOSPITAL_BASED_OUTPATIENT_CLINIC_OR_DEPARTMENT_OTHER): Payer: Self-pay | Admitting: *Deleted

## 2019-02-14 ENCOUNTER — Other Ambulatory Visit: Payer: Self-pay

## 2019-02-14 ENCOUNTER — Emergency Department (HOSPITAL_BASED_OUTPATIENT_CLINIC_OR_DEPARTMENT_OTHER)
Admission: EM | Admit: 2019-02-14 | Discharge: 2019-02-14 | Disposition: A | Discharge status: Home / Self Care | Payer: 59 | Attending: Emergency Medicine | Admitting: Emergency Medicine

## 2019-02-14 DIAGNOSIS — Z87891 Personal history of nicotine dependence: Secondary | ICD-10-CM | POA: Diagnosis not present

## 2019-02-14 DIAGNOSIS — Z79899 Other long term (current) drug therapy: Secondary | ICD-10-CM | POA: Insufficient documentation

## 2019-02-14 DIAGNOSIS — J45909 Unspecified asthma, uncomplicated: Secondary | ICD-10-CM | POA: Insufficient documentation

## 2019-02-14 DIAGNOSIS — R079 Chest pain, unspecified: Secondary | ICD-10-CM | POA: Insufficient documentation

## 2019-02-14 DIAGNOSIS — I4891 Unspecified atrial fibrillation: Secondary | ICD-10-CM | POA: Diagnosis present

## 2019-02-14 DIAGNOSIS — I480 Paroxysmal atrial fibrillation: Secondary | ICD-10-CM | POA: Insufficient documentation

## 2019-02-14 DIAGNOSIS — E860 Dehydration: Secondary | ICD-10-CM | POA: Insufficient documentation

## 2019-02-14 DIAGNOSIS — R002 Palpitations: Secondary | ICD-10-CM

## 2019-02-14 DIAGNOSIS — E876 Hypokalemia: Secondary | ICD-10-CM

## 2019-02-14 HISTORY — DX: Unspecified atrial fibrillation: I48.91

## 2019-02-14 LAB — COMPREHENSIVE METABOLIC PANEL
ALT: 58 U/L — ABNORMAL HIGH (ref 0–44)
AST: 48 U/L — ABNORMAL HIGH (ref 15–41)
Albumin: 4.4 g/dL (ref 3.5–5.0)
Alkaline Phosphatase: 72 U/L (ref 38–126)
Anion gap: 7 (ref 5–15)
BUN: 11 mg/dL (ref 6–20)
CO2: 29 mmol/L (ref 22–32)
Calcium: 9 mg/dL (ref 8.9–10.3)
Chloride: 104 mmol/L (ref 98–111)
Creatinine, Ser: 1.02 mg/dL (ref 0.61–1.24)
GFR calc Af Amer: >60 mL/min (ref >60)
GFR calc non Af Amer: >60 mL/min (ref >60)
Glucose, Bld: 89 mg/dL (ref 70–99)
Potassium: 3.3 mmol/L — ABNORMAL LOW (ref 3.5–5.1)
Sodium: 140 mmol/L (ref 135–145)
Total Bilirubin: 1.3 mg/dL — ABNORMAL HIGH (ref 0.3–1.2)
Total Protein: 7.3 g/dL (ref 6.5–8.1)

## 2019-02-14 LAB — CBC WITH DIFFERENTIAL/PLATELET
Abs Immature Granulocytes: 0.02 10*3/uL (ref 0.00–0.07)
Basophils Absolute: 0 10*3/uL (ref 0.0–0.1)
Basophils Relative: 0 %
Eosinophils Absolute: 0.3 10*3/uL (ref 0.0–0.5)
Eosinophils Relative: 3 %
HCT: 42.6 % (ref 39.0–52.0)
Hemoglobin: 14.1 g/dL (ref 13.0–17.0)
Immature Granulocytes: 0 %
Lymphocytes Relative: 30 %
Lymphs Abs: 2.9 10*3/uL (ref 0.7–4.0)
MCH: 29.7 pg (ref 26.0–34.0)
MCHC: 33.1 g/dL (ref 30.0–36.0)
MCV: 89.7 fL (ref 80.0–100.0)
Monocytes Absolute: 1.1 10*3/uL — ABNORMAL HIGH (ref 0.1–1.0)
Monocytes Relative: 11 %
Neutro Abs: 5.3 10*3/uL (ref 1.7–7.7)
Neutrophils Relative %: 56 %
Platelets: 151 10*3/uL (ref 150–400)
RBC: 4.75 MIL/uL (ref 4.22–5.81)
RDW: 13.9 % (ref 11.5–15.5)
WBC: 9.5 10*3/uL (ref 4.0–10.5)
nRBC: 0 % (ref 0.0–0.2)

## 2019-02-14 LAB — MAGNESIUM: Magnesium: 2.1 mg/dL (ref 1.7–2.4)

## 2019-02-14 MED ORDER — POTASSIUM CHLORIDE CRYS ER 20 MEQ PO TBCR
40.0000 meq | EXTENDED_RELEASE_TABLET | Freq: Once | ORAL | Status: AC
  Administered 2019-02-14: 40 meq via ORAL
  Filled 2019-02-14: qty 2

## 2019-02-14 MED ORDER — SODIUM CHLORIDE 0.9 % IV BOLUS
1000.0000 mL | Freq: Once | INTRAVENOUS | Status: AC
  Administered 2019-02-14: 1000 mL via INTRAVENOUS

## 2019-02-14 NOTE — ED Triage Notes (Signed)
Pt reports heart "feels like it's running away" x 30 mins pta. Hx of atrial fib. States his doctor is trying to wean him off his medications

## 2019-02-14 NOTE — ED Provider Notes (Signed)
[facility] EMERGENCY DEPARTMENT Provider Note   CSN: [id number] Arrival date & time: 02/14/19  1707    History   Chief Complaint Chief Complaint  Patient presents with  . Atrial Fibrillation    HPI Christian Mitchell is a 58 y.o. male.     The history is provided by the patient and medical records. No language interpreter was used.  Palpitations  Palpitations quality:  Fast Onset quality:  Sudden Duration:  30 minutes Timing:  Constant Progression:  Unchanged Chronicity:  Recurrent Context: dehydration   Context: not anxiety   Relieved by:  Nothing Worsened by:  Nothing Ineffective treatments:  None tried Associated symptoms: chest pain and chest pressure   Associated symptoms: no back pain, no cough, no diaphoresis, no dizziness, no leg pain, no lower extremity edema, no malaise/fatigue, no nausea, no near-syncope, no numbness, no shortness of breath, no vomiting and no weakness   Risk factors: hx of atrial fibrillation     Past Medical History:  Diagnosis Date  . Asthma   . Chronic back pain   . Chronic neck pain   . GERD (gastroesophageal reflux disease)   . Hepatitis C   . IV drug abuse ([place])   . Kidney stones   . Opiate misuse   . Wears glasses   . Wears partial dentures     Patient Active Problem List   Diagnosis Date Noted  . Cervical radiculitis 02/16/2013  . Chronic neck pain 11/02/2012  . Right elbow pain 11/02/2012    Past Surgical History:  Procedure Laterality Date  . [place]   reattachement, left 1st - 4th  . minor laceration repair          Home Medications    Prior to Admission medications   Medication Sig Start Date End Date Taking? Authorizing Provider  albuterol (PROVENTIL HFA;VENTOLIN HFA) 108 (90 Base) MCG/ACT inhaler INL 1 PUFF PO QID PRN 12/02/18   [provider]  diltiazem (CARDIZEM) 30 MG tablet Take 1 tablet every 4 hours AS NEEDED for AFIB heart rate >100 01/20/19   Fenton, Clint R, PA   meloxicam (MOBIC) 15 MG tablet TK 1 T PO QD PRN 07/14/18   [provider]  omeprazole (PRILOSEC) 40 MG capsule Take by mouth. 05/15/15   [provider]  tamsulosin (FLOMAX) 0.4 MG CAPS capsule Take by mouth. 05/15/15   [provider]    Family History Family History  Problem Relation Age of Onset  . Arthritis Mother   . Diabetes Mother   . Cancer Father        prostate  . Arthritis Maternal Grandmother   . Heart disease Maternal Grandmother   . Arthritis Paternal Grandmother   . Stroke Paternal Grandmother   . Diabetes Paternal Grandmother   . Cancer Paternal Grandfather        colon  . Hyperlipidemia Paternal Grandfather   . Hypertension Paternal Grandfather     Social History Social History   Tobacco Use  . Smoking status: Former Smoker    Packs/day: 1.00    Years: 25.00    Pack years: 25.00    Types: Cigarettes  . Smokeless tobacco: Never Used  Substance Use Topics  . Alcohol use: No    Frequency: Never  . Drug use: Yes    Types: IV     Allergies   Patient has no known allergies.   Review of Systems Review of Systems  Constitutional: Negative for chills, diaphoresis, fatigue,  fever and malaise/fatigue.  HENT: Negative for congestion.   Eyes: Negative for visual disturbance.  Respiratory: Negative for cough and shortness of breath.   Cardiovascular: Positive for chest pain and palpitations. Negative for near-syncope.  Gastrointestinal: Negative for abdominal pain, nausea and vomiting.  Genitourinary: Negative for flank pain and frequency.  Musculoskeletal: Negative for back pain, neck pain and neck stiffness.  Skin: Negative for rash and wound.  Neurological: Negative for dizziness, weakness, light-headedness, numbness and headaches.  Psychiatric/Behavioral: Negative for agitation.  All other systems reviewed and are negative.    Physical Exam Updated Vital Signs BP 139/83   Pulse (!) 121   Temp 97.9 F (36.6 C) (Oral)    Resp 20   Ht 6\' 1"  (1.854 m)   Wt 74.8 kg   SpO2 98%   BMI 21.77 kg/m   Physical Exam Vitals signs and nursing note reviewed.  Constitutional:      General: He is not in acute distress.    Appearance: He is well-developed. He is not ill-appearing, toxic-appearing or diaphoretic.  HENT:     Head: Normocephalic and atraumatic.     Nose: Nose normal. No congestion or rhinorrhea.  Eyes:     Conjunctiva/sclera: Conjunctivae normal.     Pupils: Pupils are equal, round, and reactive to light.  Neck:     Musculoskeletal: Neck supple. No muscular tenderness.  Cardiovascular:     Rate and Rhythm: Regular rhythm. Tachycardia present.     Pulses: Normal pulses.     Heart sounds: No murmur.  Pulmonary:     Effort: Pulmonary effort is normal. No respiratory distress.     Breath sounds: Normal breath sounds. No wheezing, rhonchi or rales.  Chest:     Chest wall: No tenderness.  Abdominal:     Palpations: Abdomen is soft.     Tenderness: There is no abdominal tenderness.  Musculoskeletal:        General: No tenderness.     Right lower leg: No edema.     Left lower leg: No edema.  Skin:    General: Skin is warm and dry.     Capillary Refill: Capillary refill takes less than 2 seconds.  Neurological:     Mental Status: He is alert.  Psychiatric:        Mood and Affect: Mood normal.      ED Treatments / Results  Labs (all labs ordered are listed, but only abnormal results are displayed) Labs Reviewed  CBC WITH DIFFERENTIAL/PLATELET - Abnormal; Notable for the following components:      Result Value   Monocytes Absolute 1.1 (*)    All other components within normal limits  COMPREHENSIVE METABOLIC PANEL - Abnormal; Notable for the following components:   Potassium 3.3 (*)    AST 48 (*)    ALT 58 (*)    Total Bilirubin 1.3 (*)    All other components within normal limits  MAGNESIUM    EKG EKG Interpretation  Date/Time:  Sunday February 14 2019 17:15:07 EDT Ventricular  Rate:  135 PR Interval:    QRS Duration: 104 QT Interval:  315 QTC Calculation: 473 R Axis:   80 Text Interpretation:  Atrial fibrillation Borderline T wave abnormalities When compared to prior, now afib with RVR.  No STEMI Confirmed by Antony Blackbird [phone]) on 02/14/2019 5:18:51 PM   Radiology No results found.  Procedures Procedures (including critical care time)  Medications Ordered in ED Medications  potassium chloride SA (K-DUR) CR tablet 40  mEq (has no administration in time range)  sodium chloride 0.9 % bolus 1,000 mL (1,000 mLs Intravenous New Bag/Given 02/14/19 1810)     Initial Impression / Assessment and Plan / ED Course  I have reviewed the triage vital signs and the nursing notes.  Pertinent labs & imaging results that were available during my care of the patient were reviewed by me and considered in my medical decision making (see chart for details).        Christian Mitchell is a 58 y.o. male with a past medical history significant for paroxysmal atrial fibrillation on diltiazem and Xarelto, prior IV drug abuse, hepatitis C, kidney stones, GERD, and asthma who presents with palpitations and chest tightness.  Patient reports that approximately 30 minutes prior to arrival, he had onset of cavitations and some chest aching and tightness similar to prior episodes of A. fib.  He reports he is still on Xarelto which was started several weeks ago and diltiazem.  He reports that his cardiologist was going to consider weaning him off of these medications however he did take it today when he started having his symptoms.  He reports that he has been taking his Xarelto regularly.  He reports no lightheadedness or syncopal episodes.  He denies significant shortness of breath but reports the palpitations and some mild chest aching.  He denies nausea, vomiting, diaphoresis, or injuries.  No other urinary symptoms or GI symptoms.  He does report that he has been feeling dehydrated and  today was working outside in the heat after drinking several beers.  He thinks he is dehydrated and this contribute to his symptoms.  EKG on arrival shows A. fib with RVR.  Heart rate was initially in the 150s and EKG showed it was in the 130s.  No STEMI.  On exam, patient converted to sinus rhythm.  Patient has regular pulse in his radial arteries that are symmetric.  Lungs clear chest is nontender.  Abdomen is nontender.  No murmur appreciated.  Patient resting comfortably and is now symptom-free.  Heart rate is between 90 and 100 on my initial evaluation.  Repeat EKG now shows a sinus rhythm with a rate of 100.  No STEMI seen.  As patient has converted to sinus rhythm, patient will be given more fluids.  He will also have screen laboratory testing to look for significant electrolyte abnormality which may have contributed to his recurrent A. Fib.  If heart rate improved with rehydration and he remains in sinus rhythm and his work-up is reassuring, anticipate discharge to follow-up with cardiology and he will continue his diltiazem and Xarelto use.  7:05 PM Laboratory testing shows mild hypokalemia and slight elevation in liver transaminases.  No evidence of acute kidney injury.  Magnesium normal.  After fluids, patient's heart rate is now between 80 and 90 and is still in sinus rhythm.  He is feeling much better and has no further symptoms.  He is hungry.  Patient will have a p.o. challenge.  He will be given oral potassium segmentation for the hypokalemia.  Suspect dehydration from his outside work, decreased fluids, and alcohol caused his episode today.  He will continue his home diltiazem and his Xarelto will call his cardiologist tomorrow to discuss further medication changes.  Patient had no other questions or concerns and understood return precautions.  He remained in sinus rhythm and was discharged in good condition.   Final Clinical Impressions(s) / ED Diagnoses   Final diagnoses:   Paroxysmal atrial  fibrillation with RVR (HCC)  Palpitations  Chest pain, unspecified type  Dehydration  Hypokalemia    ED Discharge Orders    None      Clinical Impression: 1. Paroxysmal atrial fibrillation with RVR (HCC)   2. Palpitations   3. Chest pain, unspecified type   4. Dehydration   5. Hypokalemia     Disposition: Discharge  Condition: Good  I have discussed the results, Dx and Tx plan with the pt(& family if present). He/she/they expressed understanding and agree(s) with the plan. Discharge instructions discussed at great length. Strict return precautions discussed and pt &/or family have verbalized understanding of the instructions. No further questions at time of discharge.    New Prescriptions   No medications on file    Follow Up: No follow-up provider specified.    Brienne Liguori, Gwenyth Allegra, MD 02/14/19 2013

## 2019-02-14 NOTE — ED Notes (Signed)
ED Provider at bedside. 

## 2019-02-14 NOTE — Discharge Instructions (Signed)
Your EKG on arrival showed recurrent atrial fibrillation with a fast rate.  After rehydration and monitoring, you converted back to sinus rhythm.  Your electrolytes show low potassium which was replaced.  We suspect you were dehydrated.  Please continue the diltiazem for rhythm control and the Xarelto for anticoagulation and call your cardiologist tomorrow to discuss further medication changes.  Since you have been in sinus rhythm for several hours and has had improvement in your rate, we feel you are safe for discharge home.  As her symptoms resolved completely when the rhythm went back in the sinus, we have low suspicion for cardiac injury at this time.  If any symptoms return, change, or worsen, please return to the nearest emergency department.  Please rest and stay hydrated.

## 2019-02-14 NOTE — ED Notes (Signed)
Patient was given peanut butter crackers and sprite of PO per doctor Tegeler

## 2019-02-14 NOTE — ED Notes (Signed)
Patient converted to ST from afib. EDP is aware.

## 2019-02-14 NOTE — ED Notes (Signed)
Patient denies chest pain but he stated that, "my heart is beating in the wrong place."

## 2019-02-18 ENCOUNTER — Other Ambulatory Visit: Payer: Self-pay

## 2019-02-18 ENCOUNTER — Ambulatory Visit (HOSPITAL_COMMUNITY)
Admission: RE | Admit: 2019-02-18 | Discharge: 2019-02-18 | Disposition: A | Discharge status: Home / Self Care | Payer: 59 | Source: Ambulatory Visit | Attending: Physician Assistant | Admitting: Physician Assistant

## 2019-02-18 ENCOUNTER — Other Ambulatory Visit (HOSPITAL_COMMUNITY): Payer: Self-pay | Admitting: *Deleted

## 2019-02-18 DIAGNOSIS — I480 Paroxysmal atrial fibrillation: Secondary | ICD-10-CM | POA: Diagnosis not present

## 2019-02-18 MED ORDER — POTASSIUM CHLORIDE ER 10 MEQ PO TBCR: 10.0000 meq | EXTENDED_RELEASE_TABLET | Freq: Every day | ORAL | 6 refills | Status: DC

## 2019-02-18 MED ORDER — DILTIAZEM HCL 30 MG PO TABS: ORAL_TABLET | ORAL | 2 refills | Status: DC

## 2019-02-18 MED ORDER — METOPROLOL SUCCINATE ER 25 MG PO TB24: 25.0000 mg | ORAL_TABLET | Freq: Every day | ORAL | 6 refills | Status: DC

## 2019-02-18 MED FILL — POTASSIUM CHL ER M10 TABLET: 10 | 30 days supply | Qty: 30 | Fill #0

## 2019-02-18 MED FILL — METOPROLOL SUCCINATE ER 25: 25 | 30 days supply | Qty: 30 | Fill #0

## 2019-02-18 MED FILL — dilTIAZem HCL 30 MG TABS: 30 | 8 days supply | Qty: 45 | Fill #0

## 2019-02-18 NOTE — Progress Notes (Signed)
Electrophysiology TeleHealth Note   Due to national recommendations of social distancing due to [place] 19, Audio/video telehealth visit is felt to be most appropriate for this patient at this time.  See MyChart message from today for patient consent regarding telehealth for the Atrial Fibrillation Clinic.    Date:  02/18/2019   ID:  Christian Mitchell, DOB 09/16/1961, MRN [id number]  Location: work Provider location: [address] [place], [place] [address] Evaluation Performed: Follow up  PCP:  Delilah Shan, MD   CC: Follow up for atrial fibrillation   History of Present Illness: Christian Mitchell is a 58 y.o. male who presents via audio/video conferencing for a telehealth visit today.   The patient is referred for new consultation regarding atrial fibrilaltion by [name]. Patient has a history of asthma, Hep C, and chronic pain, alcohol use. Patient was in his usual state of health until 02/14/19 when he noted heart racing symptoms similar to his previous afib episode. He took PRN diltiazem but became anxious and went to ER. He was in afib with RVR but spontaneously converted after about 30-45 minutes. Patient admits that he had been working hard outside and may have been dehydrated. He also admits to "drinking a few beers" prior to episode.   Today, he denies symptoms of chest pain, shortness of breath, orthopnea, PND, lower extremity edema, claudication, dizziness, presyncope, syncope, bleeding, or neurologic sequela. The patient is tolerating medications without difficulties and is otherwise without complaint today.   he denies symptoms of cough, fevers, chills, or new SOB worrisome for COVID 19.     Atrial Fibrillation Risk Factors:  he does have symptoms of sleep apnea. he does not have a history of rheumatic fever. he does have a history of alcohol use. The patient does not have a history of early familial atrial fibrillation or other arrhythmias.  he has a BMI of  There is no height or weight on file to calculate BMI.. There were no vitals filed for this visit.  Past Medical History:  Diagnosis Date  . Asthma   . Atrial fibrillation ([place])   . Chronic back pain   . Chronic neck pain   . GERD (gastroesophageal reflux disease)   . Hepatitis C   . IV drug abuse ([place])   . Kidney stones   . Opiate misuse   . Wears glasses   . Wears partial dentures    Past Surgical History:  Procedure Laterality Date  . [place]   reattachement, left 1st - 4th  . minor laceration repair       Current Outpatient Medications  Medication Sig Dispense Refill  . albuterol (PROVENTIL HFA;VENTOLIN HFA) 108 (90 Base) MCG/ACT inhaler INL 1 PUFF PO QID PRN    . diltiazem (CARDIZEM) 30 MG tablet Take 1 tablet every 4 hours AS NEEDED for AFIB heart rate >100 120 tablet 0  . meloxicam (MOBIC) 15 MG tablet TK 1 T PO QD PRN    . omeprazole (PRILOSEC) 20 MG capsule Take 20 mg by mouth daily.    Marland Kitchen omeprazole (PRILOSEC) 40 MG capsule Take by mouth.    . rivaroxaban (XARELTO) 20 MG TABS tablet Take 20 mg by mouth daily with supper.    . tamsulosin (FLOMAX) 0.4 MG CAPS capsule Take by mouth.     No current facility-administered medications for this encounter.     Allergies:   Patient has no known allergies.   Social History:  The  patient  reports that he has quit smoking. His smoking use included cigarettes. He has a 25.00 pack-year smoking history. He has never used smokeless tobacco. He reports previous drug use. Drug: IV. He reports that he does not drink alcohol.   Family History:  The patient's  family history includes Arthritis in his maternal grandmother, mother, and paternal grandmother; Cancer in his father and paternal grandfather; Diabetes in his mother and paternal grandmother; Heart disease in his maternal grandmother; Hyperlipidemia in his paternal grandfather; Hypertension in his paternal grandfather; Stroke in his paternal grandmother.    ROS:   Please see the history of present illness.   All other systems are personally reviewed and negative.   Exam: Well appearing, alert and conversant, regular work of breathing, good skin color.   Recent Labs: 01/06/2019: TSH 1.580 02/14/2019: ALT 58; BUN 11; Creatinine, Ser 1.02; Hemoglobin 14.1; Magnesium 2.1; Platelets 151; Potassium 3.3; Sodium 140  personally reviewed    Other studies personally reviewed: Additional studies/ records that were reviewed today include: Epic notes    ASSESSMENT AND PLAN:  1.  Paroxysmal atrial fibrillation Patient had another episode of afib. Converted with PRN diltiazem.  Will start Toprol 25 mg daily in addition to diltiazem 30 mg PRN q 4hrs. Per guidelines patient should not be on anticoagulation at this time. Echocardiogram scheduled for May.  We again discussed lifestyle changes including alcohol cessation. Patient voices understanding and agrees.   This patients CHA2DS2-VASc Score and unadjusted Ischemic Stroke Rate (% per year) is equal to 0.2 % stroke rate/year from a score of 0  Above score calculated as 1 point each if present [CHF, HTN, DM, Vascular=MI/PAD/Aortic Plaque, Age if 65-74, or Male] Above score calculated as 2 points each if present [Age > 75, or Stroke/TIA/TE]  2. Severe snoring/daytime somnolence Will arrange for sleep study once COVID-19 precautions end.  3. Hypokalemia Noted at the ER. Will start KCl 10 meq daily.   COVID screen The patient does not have any symptoms that suggest any further testing/ screening at this time.  Social distancing reinforced today.    Follow-up with the Afib clinic for telehealth visit in 2 weeks. Encouraged patient to reach out to our office if he has more episodes of afib.   Current medicines are reviewed at length with the patient today.   The patient does not have concerns regarding his medicines.  The following changes were made today:  Start Toprol daily.  Labs/ tests ordered today  include:  No orders of the defined types were placed in this encounter.   Patient Risk:  after full review of this patients clinical status, I feel that they are at moderate risk at this time.   Today, I have spent 18 minutes with the patient with telehealth technology discussing atrial fibrillation, alcohol cessation, beta blockers, and COVID-19 precautions.     Gwenlyn Perking PA-C 02/18/2019 1:26 PM  Afib [facility] [address] [place], [place] [address] [phone]

## 2019-02-25 MED FILL — VENTOLIN HFA 90 MCG INHALER: 108 (90 BAS | 50 days supply | Qty: 18 | Fill #1

## 2019-03-03 ENCOUNTER — Ambulatory Visit (HOSPITAL_COMMUNITY)
Admission: RE | Admit: 2019-03-03 | Discharge: 2019-03-03 | Disposition: A | Discharge status: Home / Self Care | Payer: 59 | Source: Ambulatory Visit | Attending: Physician Assistant | Admitting: Physician Assistant

## 2019-03-03 ENCOUNTER — Other Ambulatory Visit (HOSPITAL_COMMUNITY): Payer: Self-pay | Admitting: *Deleted

## 2019-03-03 ENCOUNTER — Encounter (HOSPITAL_COMMUNITY): Payer: Self-pay | Admitting: Physician Assistant

## 2019-03-03 ENCOUNTER — Other Ambulatory Visit: Payer: Self-pay

## 2019-03-03 VITALS — BP 135/70 | Ht 73.0 in

## 2019-03-03 DIAGNOSIS — I480 Paroxysmal atrial fibrillation: Secondary | ICD-10-CM | POA: Diagnosis not present

## 2019-03-03 NOTE — Progress Notes (Signed)
Electrophysiology TeleHealth Note   Due to national recommendations of social distancing due to [place] 19, Audio/video telehealth visit is felt to be most appropriate for this patient at this time.  See MyChart message for patient consent regarding telehealth for the Atrial Fibrillation Clinic. Consent obtained verbally.   Date:  03/03/2019   ID:  Christian Mitchell, DOB 1961/07/10, MRN [id number]  Location: work Provider location: [address] [place], [place] [address] Evaluation Performed: Follow up  PCP:  Delilah Shan, MD   CC: Follow up for atrial fibrillation   History of Present Illness: Christian Mitchell is a 58 y.o. male who presents via audio/video conferencing for a telehealth visit today.   The patient is referred for new consultation regarding atrial fibrilaltion by [name]. Patient has a history of asthma, Hep C, and chronic pain, alcohol use. Patient was in his usual state of health until 02/14/19 when he noted heart racing symptoms similar to his previous afib episode. He took PRN diltiazem but became anxious and went to ER. He was in afib with RVR but spontaneously converted after about 30-45 minutes. Patient admits that he had been working hard outside and may have been dehydrated. He also admits to "drinking a few beers" prior to episode.   On follow up today, patient reports that he is doing very well and has not had any further heart racing or palpitation symptoms. He is doing well avoiding alcohol and staying active. He admits that he continues to snore severely and also has daytime somnolence.   Today, he denies symptoms of chest pain, shortness of breath, orthopnea, PND, lower extremity edema, claudication, dizziness, presyncope, syncope, bleeding, or neurologic sequela. The patient is tolerating medications without difficulties and is otherwise without complaint today.   he denies symptoms of cough, fevers, chills, or new SOB worrisome for COVID 19.      Atrial Fibrillation Risk Factors:  he does have symptoms of sleep apnea. he does not have a history of rheumatic fever. he does have a history of alcohol use. The patient does not have a history of early familial atrial fibrillation or other arrhythmias.  he has a BMI of Body mass index is 21.77 kg/m..  BP 135/70 Provided by pt with home BP machine.  Past Medical History:  Diagnosis Date  . Asthma   . Atrial fibrillation ([place])   . Chronic back pain   . Chronic neck pain   . GERD (gastroesophageal reflux disease)   . Hepatitis C   . IV drug abuse ([place])   . Kidney stones   . Opiate misuse   . Wears glasses   . Wears partial dentures    Past Surgical History:  Procedure Laterality Date  . [place]   reattachement, left 1st - 4th  . minor laceration repair       Current Outpatient Medications  Medication Sig Dispense Refill  . albuterol (PROVENTIL HFA;VENTOLIN HFA) 108 (90 Base) MCG/ACT inhaler INL 1 PUFF PO QID PRN    . diltiazem (CARDIZEM) 30 MG tablet Take 1 tablet every 4 hours AS NEEDED for AFIB heart rate >100 45 tablet 2  . metoprolol succinate (TOPROL XL) 25 MG 24 hr tablet Take 1 tablet (25 mg total) by mouth daily. 30 tablet 6  . omeprazole (PRILOSEC) 20 MG capsule Take 20 mg by mouth daily.    . potassium chloride (K-DUR) 10 MEQ tablet Take 1 tablet (10 mEq total) by mouth daily. [place]  tablet 6   No current facility-administered medications for this encounter.     Allergies:   Patient has no known allergies.   Social History:  The patient  reports that he has quit smoking. His smoking use included cigarettes. He has a 25.00 pack-year smoking history. He has never used smokeless tobacco. He reports previous drug use. Drug: IV. He reports that he does not drink alcohol.   Family History:  The patient's  family history includes Arthritis in his maternal grandmother, mother, and paternal grandmother; Cancer in his father and paternal grandfather;  Diabetes in his mother and paternal grandmother; Heart disease in his maternal grandmother; Hyperlipidemia in his paternal grandfather; Hypertension in his paternal grandfather; Stroke in his paternal grandmother.    ROS:  Please see the history of present illness.   All other systems are personally reviewed and negative.   Exam: Well appearing, alert and conversant, regular work of breathing,  good skin color Eyes- anicteric, neuro- grossly intact, skin- no apparent rash or lesions or cyanosis, mouth- oral mucosa is pink   Recent Labs: 01/06/2019: TSH 1.580 02/14/2019: ALT 58; BUN 11; Creatinine, Ser 1.02; Hemoglobin 14.1; Magnesium 2.1; Platelets 151; Potassium 3.3; Sodium 140  personally reviewed    Other studies personally reviewed: Additional studies/ records that were reviewed today include: Epic notes    ASSESSMENT AND PLAN:  1.  Paroxysmal atrial fibrillation Patient doing well with no symptoms of heart racing. Continue Toprol 25 mg daily Continue diltiazem 30 mg PRN q 4hrs. Per guidelines patient should not be on anticoagulation at this time. Echocardiogram scheduled for later this month. We again discussed lifestyle changes including alcohol avoidance. Patient has done well with this and has only had one beer in the last two weeks.  This patients CHA2DS2-VASc Score and unadjusted Ischemic Stroke Rate (% per year) is equal to 0.2 % stroke rate/year from a score of 0  Above score calculated as 1 point each if present [CHF, HTN, DM, Vascular=MI/PAD/Aortic Plaque, Age if 65-74, or Male] Above score calculated as 2 points each if present [Age > 75, or Stroke/TIA/TE]  2. Severe snoring/daytime somnolence Will arrange for sleep study.  3. Hypokalemia Noted at the ER. Continue KCl 10 meq daily. [place] screen The patient does not have any symptoms that suggest any further testing/ screening at this time.  Social distancing reinforced today.    Follow-up  with the Afib clinic in 3 months. Encouraged pt to reestablish with PCP.   Current medicines are reviewed at length with the patient today.   The patient does not have concerns regarding his medicines.  The following changes were made today: none  Labs/ tests ordered today include: Bmet No orders of the defined types were placed in this encounter.   Patient Risk:  after full review of this patients clinical status, I feel that they are at moderate risk at this time.   Today, I have spent 15 minutes with the patient with telehealth technology discussing atrial fibrillation, alcohol cessation, beta blockers, and COVID-19 precautions.     Gwenlyn Perking PA-C 03/03/2019 11:16 AM  Afib [facility] [address] [place], [place] [address] [phone]

## 2019-03-08 ENCOUNTER — Ambulatory Visit (HOSPITAL_COMMUNITY): Payer: 59 | Attending: Physician Assistant

## 2019-03-22 MED FILL — dilTIAZem HCL 30 MG TABS: 30 | 8 days supply | Qty: 45 | Fill #1

## 2019-03-23 ENCOUNTER — Other Ambulatory Visit: Payer: Self-pay

## 2019-03-23 ENCOUNTER — Ambulatory Visit (HOSPITAL_BASED_OUTPATIENT_CLINIC_OR_DEPARTMENT_OTHER)
Admission: RE | Admit: 2019-03-23 | Discharge: 2019-03-23 | Disposition: A | Discharge status: Home / Self Care | Payer: 59 | Source: Ambulatory Visit | Attending: Physician Assistant | Admitting: Physician Assistant

## 2019-03-23 DIAGNOSIS — I480 Paroxysmal atrial fibrillation: Secondary | ICD-10-CM | POA: Insufficient documentation

## 2019-03-23 NOTE — Progress Notes (Signed)
  Echocardiogram 2D Echocardiogram has been performed.  Christian Mitchell 03/23/2019, 1:41 PM

## 2019-03-24 ENCOUNTER — Encounter (HOSPITAL_COMMUNITY): Payer: Self-pay | Admitting: *Deleted

## 2019-03-26 ENCOUNTER — Other Ambulatory Visit (HOSPITAL_COMMUNITY): Payer: 59

## 2019-03-31 ENCOUNTER — Telehealth: Payer: Self-pay | Admitting: *Deleted

## 2019-03-31 NOTE — Telephone Encounter (Signed)
PA submitted to [facility] for sleep study. Clinicals faxed to [phone].

## 2019-04-07 ENCOUNTER — Telehealth: Payer: Self-pay | Admitting: *Deleted

## 2019-04-07 NOTE — Telephone Encounter (Signed)
Stacy notified in lab sleep study denied by [facility]. Provider can order HST. No PA is required.

## 2019-04-07 NOTE — Telephone Encounter (Signed)
-----   Message from Juluis Mire, RN sent at 01/20/2019  3:33 PM EDT ----- Regarding: sleep study Pt needs sleep study for afib, snoring and daytime somnolence per ricky fenton pa. Thanks Nurse, adult Afib clinic

## 2019-04-09 ENCOUNTER — Other Ambulatory Visit: Payer: Self-pay

## 2019-04-09 ENCOUNTER — Emergency Department (HOSPITAL_BASED_OUTPATIENT_CLINIC_OR_DEPARTMENT_OTHER)
Admission: EM | Admit: 2019-04-09 | Discharge: 2019-04-09 | Discharge status: Against Medical Advice | Payer: 59 | Attending: Emergency Medicine | Admitting: Emergency Medicine

## 2019-04-09 DIAGNOSIS — Z87891 Personal history of nicotine dependence: Secondary | ICD-10-CM | POA: Insufficient documentation

## 2019-04-09 DIAGNOSIS — J4531 Mild persistent asthma with (acute) exacerbation: Secondary | ICD-10-CM | POA: Diagnosis not present

## 2019-04-09 DIAGNOSIS — Z79899 Other long term (current) drug therapy: Secondary | ICD-10-CM | POA: Insufficient documentation

## 2019-04-09 DIAGNOSIS — R0602 Shortness of breath: Secondary | ICD-10-CM | POA: Diagnosis present

## 2019-04-09 MED ORDER — ALBUTEROL SULFATE HFA 108 (90 BASE) MCG/ACT IN AERS
2.0000 | INHALATION_SPRAY | Freq: Once | RESPIRATORY_TRACT | Status: AC
  Administered 2019-04-09: 2 via RESPIRATORY_TRACT
  Filled 2019-04-09: qty 6.7

## 2019-04-09 MED ORDER — PREDNISONE 20 MG PO TABS
40.0000 mg | ORAL_TABLET | Freq: Once | ORAL | Status: AC
  Administered 2019-04-09: 40 mg via ORAL
  Filled 2019-04-09: qty 2

## 2019-04-09 NOTE — ED Triage Notes (Signed)
Pt with hx asthma, reports increased difficulty breathing due to damp weather triggering symptoms.  Ran out of MDI albuterol.   Denies fever. Respiratory therapist at bedside

## 2019-04-09 NOTE — ED Notes (Signed)
Pt left prior to being discharged by MD.  Pt left with MDI left over after treatment.  Was seen by registration staff leaving ED and getting into truck. MD made aware.

## 2019-04-09 NOTE — ED Provider Notes (Signed)
[facility] EMERGENCY DEPARTMENT Provider Note   CSN: [id number] Arrival date & time: 04/09/19  2841    History   Chief Complaint Chief Complaint  Patient presents with  . Shortness of Breath    HPI Christian Mitchell is a 58 y.o. male.     The history is provided by the patient and medical records. No language interpreter was used.  Shortness of Breath  Christian Mitchell is a 58 y.o. male who presents to the Emergency Department complaining of sob. He presents to the emergency department complaining of shortness of breath that will come from sleep around 1 AM. 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 He has been painting a lot for work recently, but he is using proper protective equipment. He awoke last night with what felt like a typical asthma exacerbation and used the last dose of his albuterol overnight. He attempted to get a refill on this medication but the pharmacist told him that he had three weeks remaining before he is able to get another refill. Albuterol is his only asthma medication. He complains of wheezing, shortness of breath and cough productive of clear sputum. He has chest discomfort related to coughing. He denies any fevers, hemoptysis, abdominal pain, nausea, vomiting,  leg swelling or pain. No known coronavirus exposures. He does not smoke or drink. He does have a history of atrial fibrillation but is not on anticoagulation.  Past Medical History:  Diagnosis Date  . Asthma   . Atrial fibrillation ([place])   . Chronic back pain   . Chronic neck pain   . GERD (gastroesophageal reflux disease)   . Hepatitis C   . IV drug abuse ([place])   . Kidney stones   . Opiate misuse   . Wears glasses   . Wears partial dentures     Patient Active Problem List   Diagnosis Date Noted  . Cervical radiculitis 02/16/2013  . Chronic neck pain 11/02/2012  . Right elbow pain 11/02/2012    Past  Surgical History:  Procedure Laterality Date  . [place]   reattachement, left 1st - 4th  . minor laceration repair          Home Medications    Prior to Admission medications   Medication Sig Start Date End Date Taking? Authorizing Provider  albuterol (PROVENTIL HFA;VENTOLIN HFA) 108 (90 Base) MCG/ACT inhaler INL 1 PUFF PO QID PRN 12/02/18   [provider]  diltiazem (CARDIZEM) 30 MG tablet Take 1 tablet every 4 hours AS NEEDED for AFIB heart rate >100 02/18/19   Fenton, Clint R, PA  metoprolol succinate (TOPROL XL) 25 MG 24 hr tablet Take 1 tablet (25 mg total) by mouth daily. 02/18/19   Fenton, Clint R, PA  omeprazole (PRILOSEC) 20 MG capsule Take 20 mg by mouth daily.    [provider]  potassium chloride (K-DUR) 10 MEQ tablet Take 1 tablet (10 mEq total) by mouth daily. 02/18/19 05/19/19  Fenton, Doloris Hall, PA    Family History Family History  Problem Relation Age of Onset  . Arthritis Mother   . Diabetes Mother   . Cancer Father        prostate  . Arthritis Maternal Grandmother   . Heart disease Maternal Grandmother   . Arthritis Paternal Grandmother   . Stroke Paternal Grandmother   . Diabetes Paternal Grandmother   . Cancer Paternal  Grandfather        colon  . Hyperlipidemia Paternal Grandfather   . Hypertension Paternal Grandfather     Social History Social History   Tobacco Use  . Smoking status: Former Smoker    Packs/day: 1.00    Years: 25.00    Pack years: 25.00    Types: Cigarettes  . Smokeless tobacco: Never Used  Substance Use Topics  . Alcohol use: No    Frequency: Never  . Drug use: Not Currently    Types: IV     Allergies   Patient has no known allergies.   Review of Systems Review of Systems  Respiratory: Positive for shortness of breath.   All other systems reviewed and are negative.    Physical Exam Updated Vital Signs BP 110/70 (BP Location: Right Arm)   Pulse 73   Temp 97.7 F (36.5 C) (Oral)    Resp 20   Ht 6\' 1"  (1.854 m)   Wt 74.8 kg   SpO2 97%   BMI 21.77 kg/m   Physical Exam Vitals signs and nursing note reviewed.  Constitutional:      Appearance: He is well-developed.  HENT:     Head: Normocephalic and atraumatic.  Cardiovascular:     Rate and Rhythm: Normal rate and regular rhythm.     Heart sounds: No murmur.  Pulmonary:     Effort: Pulmonary effort is normal. No respiratory distress.     Comments: Occasional end expiratory wheezes bilaterally. Good air movement bilaterally. Abdominal:     Palpations: Abdomen is soft.     Tenderness: There is no abdominal tenderness. There is no guarding or rebound.  Musculoskeletal:        General: No swelling or tenderness.  Skin:    General: Skin is warm and dry.  Neurological:     Mental Status: He is alert and oriented to person, place, and time.  Psychiatric:        Behavior: Behavior normal.      ED Treatments / Results  Labs (all labs ordered are listed, but only abnormal results are displayed) Labs Reviewed - No data to display  EKG None  Radiology No results found.  Procedures Procedures (including critical care time)  Medications Ordered in ED Medications  albuterol (VENTOLIN HFA) 108 (90 Base) MCG/ACT inhaler 2 puff (2 puffs Inhalation Given 04/09/19 0738)  predniSONE (DELTASONE) tablet 40 mg (40 mg Oral Given 04/09/19 0757)     Initial Impression / Assessment and Plan / ED Course  I have reviewed the triage vital signs and the nursing notes.  Pertinent labs & imaging results that were available during my care of the patient were reviewed by me and considered in my medical decision making (see chart for details).        Patient with history of asthma here for evaluation of shortness of breath consistent with prior asthma exacerbations. He does have some wheezing on examination without respiratory distress. Telemetry demonstrates sinus rhythm. Following initial assessment after albuterol  ministration he is feeling partially improved but does have some persistent wheezing. Plan to recheck after additional albuterol. Patient eloped from department prior to being able to perform an additional recheck.  Final Clinical Impressions(s) / ED Diagnoses   Final diagnoses:  Mild persistent asthma with exacerbation    ED Discharge Orders    None       Quintella Reichert, MD 04/09/19 [phone]

## 2019-04-21 MED FILL — VENTOLIN HFA 90 MCG INHALER: 108 (90 BAS | 50 days supply | Qty: 18 | Fill #2

## 2019-04-26 ENCOUNTER — Other Ambulatory Visit: Payer: Self-pay

## 2019-04-26 ENCOUNTER — Other Ambulatory Visit (HOSPITAL_COMMUNITY): Payer: Self-pay | Admitting: *Deleted

## 2019-04-26 ENCOUNTER — Encounter (HOSPITAL_COMMUNITY): Payer: Self-pay | Admitting: Physician Assistant

## 2019-04-26 ENCOUNTER — Ambulatory Visit (HOSPITAL_COMMUNITY)
Admission: RE | Admit: 2019-04-26 | Discharge: 2019-04-26 | Disposition: A | Discharge status: Home / Self Care | Payer: 59 | Source: Ambulatory Visit | Attending: Physician Assistant | Admitting: Physician Assistant

## 2019-04-26 VITALS — BP 118/70 | HR 74 | Ht 73.0 in | Wt 159.0 lb

## 2019-04-26 DIAGNOSIS — Z8619 Personal history of other infectious and parasitic diseases: Secondary | ICD-10-CM | POA: Insufficient documentation

## 2019-04-26 DIAGNOSIS — Z8042 Family history of malignant neoplasm of prostate: Secondary | ICD-10-CM | POA: Diagnosis not present

## 2019-04-26 DIAGNOSIS — Z8261 Family history of arthritis: Secondary | ICD-10-CM | POA: Insufficient documentation

## 2019-04-26 DIAGNOSIS — J45909 Unspecified asthma, uncomplicated: Secondary | ICD-10-CM | POA: Diagnosis not present

## 2019-04-26 DIAGNOSIS — Z87442 Personal history of urinary calculi: Secondary | ICD-10-CM | POA: Diagnosis not present

## 2019-04-26 DIAGNOSIS — I4819 Other persistent atrial fibrillation: Secondary | ICD-10-CM

## 2019-04-26 DIAGNOSIS — Z79899 Other long term (current) drug therapy: Secondary | ICD-10-CM | POA: Insufficient documentation

## 2019-04-26 DIAGNOSIS — R0683 Snoring: Secondary | ICD-10-CM | POA: Insufficient documentation

## 2019-04-26 DIAGNOSIS — Z87891 Personal history of nicotine dependence: Secondary | ICD-10-CM | POA: Diagnosis not present

## 2019-04-26 DIAGNOSIS — K219 Gastro-esophageal reflux disease without esophagitis: Secondary | ICD-10-CM | POA: Diagnosis not present

## 2019-04-26 DIAGNOSIS — R400 Somnolence: Secondary | ICD-10-CM | POA: Diagnosis not present

## 2019-04-26 DIAGNOSIS — Z833 Family history of diabetes mellitus: Secondary | ICD-10-CM | POA: Insufficient documentation

## 2019-04-26 DIAGNOSIS — I480 Paroxysmal atrial fibrillation: Secondary | ICD-10-CM | POA: Diagnosis not present

## 2019-04-26 MED ORDER — METOPROLOL SUCCINATE ER 25 MG PO TB24: 25.0000 mg | ORAL_TABLET | Freq: Every day | ORAL | 1 refills | Status: DC

## 2019-04-26 MED ORDER — FLECAINIDE ACETATE 50 MG PO TABS: 50.0000 mg | ORAL_TABLET | Freq: Two times a day (BID) | ORAL | 3 refills | Status: DC

## 2019-04-26 MED ORDER — DILTIAZEM HCL 30 MG PO TABS: ORAL_TABLET | ORAL | 2 refills | Status: DC

## 2019-04-26 MED FILL — dilTIAZem HCL 30 MG TABS: 30 | 7 days supply | Qty: 45 | Fill #0

## 2019-04-26 MED FILL — METOPROLOL SUCCINATE ER 25: 25 | 90 days supply | Qty: 90 | Fill #0

## 2019-04-26 MED FILL — FLECAINIDE ACETATE 50 MG TA: 50 | 30 days supply | Qty: 60 | Fill #0

## 2019-04-26 NOTE — Patient Instructions (Signed)
Start Flecainide 50mg  twice a day  Take metoprolol every day

## 2019-04-26 NOTE — Progress Notes (Signed)
Primary Care Physician: Delilah Shan, MD Primary Cardiologist: none Primary Electrophysiologist: none Referring Physician: [name]   Christian Mitchell is a 58 y.o. male with a history of asthma, Hep C, and chronic pain, alcohol use, and paroxysmal atrial fibrillation who presents for follow up in the [facility]. Patient was in his usual state of health until 02/14/19 when he noted heart racing symptoms similar to his previous afib episode. He took PRN diltiazem but became anxious and went to ER. He was in afib with RVR but spontaneously converted after about 30-45 minutes. Patient admits that he had been working hard outside and may have been dehydrated. He also admits to "drinking a few beers" prior to episode. He does admit to snoring and daytime somnolence.   On follow up today, patient reports that he has had several episodes of heart racing. Episodes usaually respond well to PRN CCB. The longest episode lasted for several hours. No specific triggers that he could identify. He has done well with lifestyle changes and has not had any alcohol since his last visit.  Today, he denies symptoms of palpitations, chest pain, shortness of breath, orthopnea, PND, lower extremity edema, dizziness, presyncope, syncope, bleeding, or neurologic sequela. The patient is tolerating medications without difficulties and is otherwise without complaint today.    Atrial Fibrillation Risk Factors:  he does have symptoms of sleep apnea. Sleep study pending. he does not have a history of rheumatic fever. he does have a history of alcohol use. The patient does not have a history of early familial atrial fibrillation or other arrhythmias.  he has a BMI of Body mass index is 20.98 kg/m.Marland Kitchen Filed Weights   04/26/19 1128  Weight: 72.1 kg    Family History  Problem Relation Age of Onset  . Arthritis Mother   . Diabetes Mother   . Cancer Father        prostate  .  Arthritis Maternal Grandmother   . Heart disease Maternal Grandmother   . Arthritis Paternal Grandmother   . Stroke Paternal Grandmother   . Diabetes Paternal Grandmother   . Cancer Paternal Grandfather        colon  . Hyperlipidemia Paternal Grandfather   . Hypertension Paternal Grandfather      Atrial Fibrillation Management history:  Previous antiarrhythmic drugs: none Previous cardioversions: one Previous ablations: none CHADS2VASC score: 0 Anticoagulation history: Xarelto (stopped)   Past Medical History:  Diagnosis Date  . Asthma   . Atrial fibrillation ([place])   . Chronic back pain   . Chronic neck pain   . GERD (gastroesophageal reflux disease)   . Hepatitis C   . IV drug abuse ([place])   . Kidney stones   . Opiate misuse   . Wears glasses   . Wears partial dentures    Past Surgical History:  Procedure Laterality Date  . [place]   reattachement, left 1st - 4th  . minor laceration repair      Current Outpatient Medications  Medication Sig Dispense Refill  . albuterol (PROVENTIL HFA;VENTOLIN HFA) 108 (90 Base) MCG/ACT inhaler INL 1 PUFF PO QID PRN    . diltiazem (CARDIZEM) 30 MG tablet Take 1 tablet every 4 hours AS NEEDED for AFIB heart rate >100 45 tablet 2  . metoprolol succinate (TOPROL-XL) 25 MG 24 hr tablet Take 1 tablet (25 mg total) by mouth daily. 90 tablet 1  . omeprazole (PRILOSEC) 20 MG capsule Take 20 mg  by mouth daily.    . potassium chloride (K-DUR) 10 MEQ tablet Take 1 tablet (10 mEq total) by mouth daily. 30 tablet 6  . flecainide (TAMBOCOR) 50 MG tablet Take 1 tablet (50 mg total) by mouth 2 (two) times daily. 60 tablet 3   No current facility-administered medications for this encounter.     No Known Allergies  Social History   Socioeconomic History  . Marital status: Married    Spouse name: Not on file  . Number of children: Not on file  . Years of education: Not on file  . Highest education level: Not on file   Occupational History  . Not on file  Social Needs  . Financial resource strain: Not on file  . Food insecurity    Worry: Not on file    Inability: Not on file  . Transportation needs    Medical: Not on file    Non-medical: Not on file  Tobacco Use  . Smoking status: Former Smoker    Packs/day: 1.00    Years: 25.00    Pack years: 25.00    Types: Cigarettes  . Smokeless tobacco: Never Used  Substance and Sexual Activity  . Alcohol use: No    Frequency: Never  . Drug use: Not Currently    Types: IV  . Sexual activity: Not on file  Lifestyle  . Physical activity    Days per week: Not on file    Minutes per session: Not on file  . Stress: Not on file  Relationships  . Social Herbalist on phone: Not on file    Gets together: Not on file    Attends religious service: Not on file    Active member of club or organization: Not on file    Attends meetings of clubs or organizations: Not on file    Relationship status: Not on file  . Intimate partner violence    Fear of current or ex partner: Not on file    Emotionally abused: Not on file    Physically abused: Not on file    Forced sexual activity: Not on file  Other Topics Concern  . Not on file  Social History Narrative   Married for 5 years-fourth marriage   Lives with wife and 3 stepchildren   Grew up in [place], [place] are reviewed and negative except as per the HPI above.  Physical Exam: Vitals:   04/26/19 1128  BP: 118/70  Pulse: 74  Weight: 72.1 kg  Height: 6\' 1"  (1.854 m)    GEN- The patient is well appearing, alert and oriented x 3 today.   Head- normocephalic, atraumatic Eyes-  Sclera clear, conjunctiva pink Ears- hearing intact Oropharynx- clear Neck- supple  Lungs- Clear to ausculation bilaterally, normal work of breathing Heart- Regular rate and rhythm, no murmurs, rubs or gallops  GI- soft, NT, ND, + BS Extremities- no clubbing, cyanosis, or edema MS- no  significant deformity or atrophy Skin- no rash or lesion Psych- euthymic mood, full affect Neuro- strength and sensation are intact  Wt Readings from Last 3 Encounters:  04/26/19 72.1 kg  04/09/19 74.8 kg  02/14/19 74.8 kg    EKG today demonstrates SR HR 74, PR 158, QRS 104, QTc 424  Echo 03/23/19 demonstrated   1. The left ventricle has normal systolic function, with an ejection fraction of 55-60%. The cavity size was normal. Left ventricular diastolic parameters were normal.  2.  The right ventricle has normal systolic function. The cavity was normal. There is no increase in right ventricular wall thickness.  3. No evidence of mitral valve stenosis.  4. The aortic valve is tricuspid. No stenosis of the aortic valve.  5. The ascending aorta is normal in size and structure.  Epic records are reviewed at length today  Assessment and Plan:  1. Paroxysmal atrial fibrillation The patient has paroxysmal atrial fibrillation.   Patient appears to be having more breakthrough episodes lately.  We discussed AAD therapy.  Will plan to start flecainide 50 mg BID Continue metoprolol 25 mg daily Continue diltiazem 30 mg PRN q4hrs Doing well with lifestyle changes.  This patients CHA2DS2-VASc Score and unadjusted Ischemic Stroke Rate (% per year) is equal to 0.2 % stroke rate/year from a score of 0  Above score calculated as 1 point each if present [CHF, HTN, DM, Vascular=MI/PAD/Aortic Plaque, Age if 65-74, or Male] Above score calculated as 2 points each if present [Age > 75, or Stroke/TIA/TE]   2. Snoring/daytime somnolence  The importance of adequate treatment of sleep apnea was discussed today in order to improve our ability to maintain sinus rhythm long term. Sleep study at sleep center denied by insurance. Will try to arrange for home sleep study.   Follow up in AF clinic next week for ECG after flecainide start.   [facility] [address] [place], [place] [address] [phone] 04/26/2019 12:15 PM

## 2019-05-03 ENCOUNTER — Telehealth: Payer: Self-pay | Admitting: *Deleted

## 2019-05-03 ENCOUNTER — Inpatient Hospital Stay (HOSPITAL_COMMUNITY): Admission: RE | Admit: 2019-05-03 | Payer: 59 | Source: Ambulatory Visit | Admitting: Physician Assistant

## 2019-05-03 NOTE — Telephone Encounter (Signed)
-----   Message from Lauralee Evener, [place] sent at 04/26/2019  3:00 PM EDT ----- Regarding: RE: sleep study  ----- Message ----- From: Juluis Mire, RN Sent: 04/26/2019  12:06 PM EDT To: [facility] Div Sleep Studies Subject: FW: sleep study                                Looks like insurance rejected in lab study - per clint fenton ok to schedule for home sleep study. Please arrange. Thanks Nurse, adult ----- Message ----- From: Juluis Mire, RN Sent: 01/20/2019   3:33 PM EDT To: [facility] Sleep Studies Subject: sleep study                                    Pt needs sleep study for afib, snoring and daytime somnolence per ricky fenton pa. Thanks Nurse, adult Afib clinic

## 2019-05-03 NOTE — Telephone Encounter (Signed)
Patient is aware and agreeable to Home Sleep Study through [facility]. Patient is scheduled for 9/16 at 10 am to pick up home sleep kit and meet with Respiratory therapist at [facility]. Patient is aware that if this appointment date and time does not work for them they should contact Artis Delay directly at [phone]. Patient is aware that a sleep packet will be sent from [facility]. Left detailed message on voicemail with date and time of HST and informed patient to call back to confirm or reschedule.

## 2019-05-04 MED FILL — predniSONE 20 MG TABS: 20 | 5 days supply | Qty: 15 | Fill #0

## 2019-05-04 MED FILL — VENTOLIN HFA 90 MCG INHALER: 108 (90 BAS | 25 days supply | Qty: 18 | Fill #0

## 2019-05-04 MED FILL — ADVAIR 250/50 DISKUS: 250-50 | 30 days supply | Qty: 60 | Fill #0

## 2019-05-21 MED FILL — dilTIAZem HCL 30 MG TABS: 30 | 7 days supply | Qty: 45 | Fill #1

## 2019-05-21 MED FILL — FLECAINIDE ACETATE 50 MG TA: 50 | 30 days supply | Qty: 60 | Fill #1

## 2019-05-22 ENCOUNTER — Other Ambulatory Visit: Payer: Self-pay | Admitting: Cardiology

## 2019-05-22 MED ORDER — FLECAINIDE ACETATE 50 MG PO TABS: 50.0000 mg | ORAL_TABLET | Freq: Two times a day (BID) | ORAL | 0 refills | Status: DC

## 2019-05-22 NOTE — Progress Notes (Signed)
Pt called reporting his usual Pharmacy is closed today and he ran out of his Flecainide 50mg  BID. Asked for Rx to be sent to [facility] in [facility]. Rx sent.

## 2019-06-04 MED FILL — FLECAINIDE ACETATE 50 MG TA: 50 | 30 days supply | Qty: 60 | Fill #1

## 2019-06-04 MED FILL — VENTOLIN HFA 90 MCG INHALER: 108 (90 BAS | 25 days supply | Qty: 18 | Fill #1

## 2019-06-04 MED FILL — dilTIAZem HCL 30 MG TABS: 30 | 7 days supply | Qty: 45 | Fill #1

## 2019-06-04 MED FILL — ADVAIR 250/50 DISKUS: 250-50 | 30 days supply | Qty: 60 | Fill #1

## 2019-06-23 MED FILL — VENTOLIN HFA 90 MCG INHALER: 108 (90 BAS | 25 days supply | Qty: 18 | Fill #2

## 2019-07-07 ENCOUNTER — Ambulatory Visit (HOSPITAL_BASED_OUTPATIENT_CLINIC_OR_DEPARTMENT_OTHER): Payer: 59 | Attending: Physician Assistant

## 2019-07-13 MED FILL — VENTOLIN HFA 90 MCG INHALER: 108 (90 BAS | 25 days supply | Qty: 18 | Fill #0

## 2019-07-14 MED FILL — dilTIAZem HCL 30 MG TABS: 30 | 7 days supply | Qty: 45 | Fill #2

## 2019-07-14 MED FILL — FLECAINIDE ACETATE 50 MG TA: 50 | 30 days supply | Qty: 60 | Fill #2

## 2019-07-23 MED FILL — METHOCARBAMOL 500 MG TABS: 500 | 7 days supply | Qty: 28 | Fill #0

## 2019-07-23 MED FILL — NABUMETONE 750 MG TABLET: 750 | 14 days supply | Qty: 28 | Fill #0

## 2019-08-27 MED FILL — VENTOLIN HFA 90 MCG INHALER: 108 (90 BAS | 25 days supply | Qty: 18 | Fill #1

## 2019-08-27 MED FILL — NABUMETONE 750 MG TABS: 750 | 14 days supply | Qty: 28 | Fill #1

## 2019-08-27 MED FILL — FLECAINIDE ACETATE 50 MG TA: 50 | 30 days supply | Qty: 60 | Fill #3

## 2019-08-27 MED FILL — METHOCARBAMOL 500 MG TABLET: 500 | 7 days supply | Qty: 28 | Fill #1

## 2019-09-04 DIAGNOSIS — F1310 Sedative, hypnotic or anxiolytic abuse, uncomplicated: Secondary | ICD-10-CM

## 2019-09-04 HISTORY — DX: Sedative, hypnotic or anxiolytic abuse, uncomplicated: F13.10

## 2019-09-08 MED FILL — BUPRENORPHINE 8 MG TAB SL: 8 | 30 days supply | Qty: 60 | Fill #0

## 2019-09-09 MED FILL — MEGESTROL ACET 40 MG/ML SUS: 40 | 31 days supply | Qty: 480 | Fill #0

## 2019-09-21 DIAGNOSIS — F1190 Opioid use, unspecified, uncomplicated: Secondary | ICD-10-CM

## 2019-09-21 HISTORY — DX: Opioid use, unspecified, uncomplicated: F11.90

## 2019-09-21 MED FILL — MEGESTROL ACET 40 MG/ML SUS: 40 | 12 days supply | Qty: 240 | Fill #0

## 2019-09-28 ENCOUNTER — Other Ambulatory Visit (HOSPITAL_COMMUNITY): Payer: Self-pay | Admitting: Physician Assistant

## 2019-09-28 MED FILL — FLECAINIDE ACETATE 50 MG TA: 50 | 30 days supply | Qty: 60 | Fill #0

## 2019-09-28 MED FILL — VENTOLIN HFA 90 MCG INHALER: 108 (90 BAS | 25 days supply | Qty: 18 | Fill #2

## 2019-09-28 MED FILL — DILTIAZEM HCL 30 MG TABS: 30 | 8 days supply | Qty: 45 | Fill #0

## 2019-09-29 MED FILL — CLINDAMYCIN HCL 150 MG CAPS: 150 | 10 days supply | Qty: 30 | Fill #0

## 2019-09-30 MED FILL — MEGESTROL ACET 40 MG/ML SUS: 40 | 12 days supply | Qty: 240 | Fill #1

## 2019-10-05 DIAGNOSIS — R630 Anorexia: Secondary | ICD-10-CM | POA: Insufficient documentation

## 2019-10-05 MED FILL — BUPRENORPHINE HCL 8 MG SUBL: 8 | 30 days supply | Qty: 60 | Fill #0

## 2019-10-05 MED FILL — IBUPROFEN 800 MG TAB: 800 | 10 days supply | Qty: 30 | Fill #0

## 2019-10-12 MED FILL — IBUPROFEN 800 MG TAB: 800 | 6 days supply | Qty: 24 | Fill #0

## 2019-10-12 MED FILL — MEGESTROL ACET 40 MG/ML SUS: 40 | 12 days supply | Qty: 240 | Fill #2

## 2019-10-12 MED FILL — CLINDAMYCIN HCL 300 MG CAPS: 300 | 10 days supply | Qty: 30 | Fill #0

## 2019-11-04 MED FILL — BUPRENORPHINE 8 MG TAB SL: 8 | 30 days supply | Qty: 60 | Fill #0

## 2019-11-04 MED FILL — MEGESTROL ACET 40 MG/ML SUS: 40 | 12 days supply | Qty: 240 | Fill #0

## 2019-11-22 ENCOUNTER — Ambulatory Visit (HOSPITAL_COMMUNITY)
Admission: RE | Admit: 2019-11-22 | Discharge: 2019-11-22 | Disposition: A | Discharge status: Home / Self Care | Payer: 59 | Source: Ambulatory Visit | Attending: Physician Assistant | Admitting: Physician Assistant

## 2019-11-22 ENCOUNTER — Other Ambulatory Visit: Payer: Self-pay

## 2019-11-22 VITALS — BP 130/82 | HR 89 | Ht 73.0 in | Wt 186.4 lb

## 2019-11-22 DIAGNOSIS — Z8619 Personal history of other infectious and parasitic diseases: Secondary | ICD-10-CM | POA: Diagnosis not present

## 2019-11-22 DIAGNOSIS — G8929 Other chronic pain: Secondary | ICD-10-CM | POA: Diagnosis not present

## 2019-11-22 DIAGNOSIS — Z87891 Personal history of nicotine dependence: Secondary | ICD-10-CM | POA: Diagnosis not present

## 2019-11-22 DIAGNOSIS — Z79899 Other long term (current) drug therapy: Secondary | ICD-10-CM | POA: Diagnosis not present

## 2019-11-22 DIAGNOSIS — Z833 Family history of diabetes mellitus: Secondary | ICD-10-CM | POA: Insufficient documentation

## 2019-11-22 DIAGNOSIS — J45909 Unspecified asthma, uncomplicated: Secondary | ICD-10-CM | POA: Diagnosis not present

## 2019-11-22 DIAGNOSIS — I480 Paroxysmal atrial fibrillation: Secondary | ICD-10-CM

## 2019-11-22 DIAGNOSIS — Z87442 Personal history of urinary calculi: Secondary | ICD-10-CM | POA: Diagnosis not present

## 2019-11-22 DIAGNOSIS — R400 Somnolence: Secondary | ICD-10-CM | POA: Insufficient documentation

## 2019-11-22 DIAGNOSIS — R0683 Snoring: Secondary | ICD-10-CM | POA: Insufficient documentation

## 2019-11-22 DIAGNOSIS — K219 Gastro-esophageal reflux disease without esophagitis: Secondary | ICD-10-CM | POA: Diagnosis not present

## 2019-11-22 HISTORY — DX: Paroxysmal atrial fibrillation: I48.0

## 2019-11-22 MED ORDER — METOPROLOL SUCCINATE ER 25 MG PO TB24: 25.0000 mg | ORAL_TABLET | Freq: Every day | ORAL | 2 refills | Status: DC

## 2019-11-22 MED ORDER — FLECAINIDE ACETATE 50 MG PO TABS: 50.0000 mg | ORAL_TABLET | Freq: Two times a day (BID) | ORAL | 6 refills | Status: DC

## 2019-11-22 MED ORDER — DILTIAZEM HCL 30 MG PO TABS: ORAL_TABLET | ORAL | 2 refills | Status: DC

## 2019-11-22 MED FILL — MEGESTROL ACET 40 MG/ML SUS: 40 | 12 days supply | Qty: 240 | Fill #1

## 2019-11-22 MED FILL — DILTIAZEM HCL 30 MG TABS: 30 | 8 days supply | Qty: 45 | Fill #0

## 2019-11-22 MED FILL — FLECAINIDE ACETATE 50 MG TA: 50 | 30 days supply | Qty: 60 | Fill #0

## 2019-11-22 MED FILL — METOPROLOL SUCCINATE ER 25: 25 | 30 days supply | Qty: 30 | Fill #0

## 2019-11-22 NOTE — Progress Notes (Signed)
Primary Care Physician: Delilah Shan, MD Primary Cardiologist: none Primary Electrophysiologist: none Referring Physician: [name]   Christian Mitchell is a 59 y.o. male with a history of asthma, Hep C, and chronic pain, alcohol use, and paroxysmal atrial fibrillation who presents for follow up in the [facility]. Patient was in his usual state of health until 02/14/19 when he noted heart racing symptoms similar to his previous afib episode. He took PRN diltiazem but became anxious and went to ER. He was in afib with RVR but spontaneously converted after about 30-45 minutes. Patient admits that he had been working hard outside and may have been dehydrated. He also admits to "drinking a few beers" prior to episode. He does admit to snoring and daytime somnolence.   On follow up today, patient reports that he has done well since his last visit up until 11/21/19 when he had heart racing and SOB during the night. There were no triggering factors that the patient could identify. Sleep study was ordered but this was never completed. Patient does admit he inadvertently stopped metoprolol. His symptoms resolved by the morning.   Today, he denies symptoms of chest pain, shortness of breath, orthopnea, PND, lower extremity edema, dizziness, presyncope, syncope, bleeding, or neurologic sequela. The patient is tolerating medications without difficulties and is otherwise without complaint today.    Atrial Fibrillation Risk Factors:  he does have symptoms of sleep apnea. Sleep study ordered. he does not have a history of rheumatic fever. he does have a history of alcohol use. The patient does not have a history of early familial atrial fibrillation or other arrhythmias.  he has a BMI of Body mass index is 24.59 kg/m.Marland Kitchen Filed Weights   11/22/19 1338  Weight: 84.6 kg    Family History  Problem Relation Age of Onset  . Arthritis Mother   . Diabetes Mother   .  Cancer Father        prostate  . Arthritis Maternal Grandmother   . Heart disease Maternal Grandmother   . Arthritis Paternal Grandmother   . Stroke Paternal Grandmother   . Diabetes Paternal Grandmother   . Cancer Paternal Grandfather        colon  . Hyperlipidemia Paternal Grandfather   . Hypertension Paternal Grandfather      Atrial Fibrillation Management history:  Previous antiarrhythmic drugs: flecainide Previous cardioversions: none Previous ablations: none CHADS2VASC score: 0 Anticoagulation history: Xarelto (stopped)   Past Medical History:  Diagnosis Date  . Asthma   . Atrial fibrillation ([place])   . Chronic back pain   . Chronic neck pain   . GERD (gastroesophageal reflux disease)   . Hepatitis C   . IV drug abuse ([place])   . Kidney stones   . Opiate misuse   . Wears glasses   . Wears partial dentures    Past Surgical History:  Procedure Laterality Date  . [place]   reattachement, left 1st - 4th  . minor laceration repair      Current Outpatient Medications  Medication Sig Dispense Refill  . albuterol (PROVENTIL HFA;VENTOLIN HFA) 108 (90 Base) MCG/ACT inhaler INL 1 PUFF PO QID PRN    . buprenorphine (SUBUTEX) 8 MG SUBL SL tablet     . diltiazem (CARDIZEM) 30 MG tablet TAKE 1 TABLET BY MOUTH EVERY 4 HOURS AS NEEDED FOR AFIB HEART RATE >100. 45 tablet 2  . flecainide (TAMBOCOR) 50 MG tablet Take 1 tablet (50  mg total) by mouth 2 (two) times daily. 60 tablet 6  . megestrol (MEGACE) 40 MG/ML suspension Take by mouth.    . meloxicam (MOBIC) 15 MG tablet as needed.    . metoprolol succinate (TOPROL-XL) 25 MG 24 hr tablet Take 1 tablet (25 mg total) by mouth daily. 90 tablet 2  . Misc Natural Products (OSTEO BI-FLEX JOINT SHIELD PO) Take by mouth 2 (two) times daily.    . Multiple Vitamin (ONE-A-DAY MENS PO) Take by mouth daily.    . Omega-3 1000 MG CAPS Take by mouth 3 (three) times daily.    Marland Kitchen omeprazole (PRILOSEC) 20 MG capsule Take 20 mg by  mouth daily.    . potassium chloride (K-DUR) 10 MEQ tablet Take 1 tablet (10 mEq total) by mouth daily. 30 tablet 6  . tamsulosin (FLOMAX) 0.4 MG CAPS capsule Take by mouth.     No current facility-administered medications for this encounter.    Allergies  Allergen Reactions  . Other     Social History   Socioeconomic History  . Marital status: Married    Spouse name: Not on file  . Number of children: Not on file  . Years of education: Not on file  . Highest education level: Not on file  Occupational History  . Not on file  Tobacco Use  . Smoking status: Former Smoker    Packs/day: 1.00    Years: 25.00    Pack years: 25.00    Types: Cigarettes  . Smokeless tobacco: Never Used  Substance and Sexual Activity  . Alcohol use: No  . Drug use: Not Currently    Types: IV  . Sexual activity: Not on file  Other Topics Concern  . Not on file  Social History Narrative   Married for 5 years-fourth marriage   Lives with wife and 3 stepchildren   Grew up in [place], [place]   Social Determinants of Health   Financial Resource Strain:   . Difficulty of Paying Living Expenses: Not on file  Food Insecurity:   . Worried About Charity fundraiser in the Last Year: Not on file  . Ran Out of Food in the Last Year: Not on file  Transportation Needs:   . Lack of Transportation (Medical): Not on file  . Lack of Transportation (Non-Medical): Not on file  Physical Activity:   . Days of Exercise per Week: Not on file  . Minutes of Exercise per Session: Not on file  Stress:   . Feeling of Stress : Not on file  Social Connections:   . Frequency of Communication with Friends and Family: Not on file  . Frequency of Social Gatherings with Friends and Family: Not on file  . Attends Religious Services: Not on file  . Active Member of Clubs or Organizations: Not on file  . Attends Archivist Meetings: Not on file  . Marital Status: Not on file  Intimate Partner Violence:   .  Fear of Current or Ex-Partner: Not on file  . Emotionally Abused: Not on file  . Physically Abused: Not on file  . Sexually Abused: Not on file     ROS- All systems are reviewed and negative except as per the HPI above.  Physical Exam: Vitals:   11/22/19 1338  BP: 130/82  Pulse: 89  Weight: 84.6 kg  Height: 6\' 1"  (1.854 m)    GEN- The patient is well appearing, alert and oriented x 3 today.   HEENT-head normocephalic, atraumatic,  sclera clear, conjunctiva pink, hearing intact, trachea midline. Lungs- Clear to ausculation bilaterally, normal work of breathing Heart- Regular rate and rhythm, no murmurs, rubs or gallops  GI- soft, NT, ND, + BS Extremities- no clubbing, cyanosis, or edema MS- no significant deformity or atrophy Skin- no rash or lesion Psych- euthymic mood, full affect Neuro- strength and sensation are intact   Wt Readings from Last 3 Encounters:  11/22/19 84.6 kg  04/26/19 72.1 kg  04/09/19 74.8 kg    EKG today demonstrates SR HR 89, PR 186, QRS 104, QTc 430  Echo 03/23/19 demonstrated   1. The left ventricle has normal systolic function, with an ejection fraction of 55-60%. The cavity size was normal. Left ventricular diastolic parameters were normal.  2. The right ventricle has normal systolic function. The cavity was normal. There is no increase in right ventricular wall thickness.  3. No evidence of mitral valve stenosis.  4. The aortic valve is tricuspid. No stenosis of the aortic valve.  5. The ascending aorta is normal in size and structure.  Epic records are reviewed at length today  Assessment and Plan:  1. Paroxysmal atrial fibrillation Patient back in SR, this is the first episode in several months.  Continue flecainide 50 mg BID. Discussed increasing this if afib becomes more persistent.  Resume metoprolol 25 mg daily. Continue diltiazem 30 mg PRN q4hrs  This patients CHA2DS2-VASc Score and unadjusted Ischemic Stroke Rate (% per year) is  equal to 0.2 % stroke rate/year from a score of 0  Above score calculated as 1 point each if present [CHF, HTN, DM, Vascular=MI/PAD/Aortic Plaque, Age if 65-74, or Male] Above score calculated as 2 points each if present [Age > 75, or Stroke/TIA/TE]   2. Snoring/daytime somnolence  The importance of adequate treatment of sleep apnea was discussed today in order to improve our ability to maintain sinus rhythm long term. Sleep study reordered.    Follow up in the AF clinic in 3 months.    [facility] [address] [place], [place] [address] [phone] 11/22/2019 4:36 PM

## 2019-11-25 ENCOUNTER — Telehealth: Payer: Self-pay | Admitting: *Deleted

## 2019-11-25 NOTE — Telephone Encounter (Signed)
Staff message sent to Gae Bon ok to schedule sleep study. [place] # [id number]. Valid dates 11/25/19 to 02/18/20.

## 2019-12-03 MED FILL — MEGESTROL ACETATE 40 MG/ML: 40 | 12 days supply | Qty: 240 | Fill #0

## 2019-12-03 MED FILL — BUPRENORPHINE 8 MG TAB SL: 8 | 30 days supply | Qty: 60 | Fill #0

## 2019-12-03 MED FILL — VENTOLIN HFA 90 MCG INHALER: 108 (90 BAS | 25 days supply | Qty: 18 | Fill #0

## 2019-12-03 MED FILL — DILTIAZEM HCL 30 MG TABS: 30 | 8 days supply | Qty: 45 | Fill #1

## 2019-12-08 ENCOUNTER — Telehealth: Payer: Self-pay | Admitting: *Deleted

## 2019-12-08 NOTE — Telephone Encounter (Signed)
Patient is scheduled for lab study on 12/16/19. Pt is scheduled for COVID screening on 12/13/19 2:40 prior to SS.  Patient understands her sleep study will be done at [facility] sleep lab. Patient understands he will receive a sleep packet in a week or so. Patient understands to call if he does not receive the sleep packet in a timely manner. Patient agrees with treatment and thanked me for call.

## 2019-12-08 NOTE — Telephone Encounter (Signed)
-----   Message from Lauralee Evener, [place] sent at 11/25/2019  8:48 AM EST ----- Regarding: RE: sleep study Auth received from [facility]. Ok to schedule. Auth # [id number]. Valid dates 11/25/19 to 02/18/20. ----- Message ----- From: Juluis Mire, RN Sent: 11/22/2019   2:14 PM EST To: [facility] Sleep Studies Subject: sleep study                                    Pt needs sleep study for afib, snoring, daytime somnolence per ricky fenton pa. ThanksStacy

## 2019-12-13 ENCOUNTER — Inpatient Hospital Stay (HOSPITAL_COMMUNITY): Admission: RE | Admit: 2019-12-13 | Payer: 59 | Source: Ambulatory Visit

## 2019-12-13 MED FILL — MEGESTROL ACET 40 MG/ML SUS: 40 | 12 days supply | Qty: 240 | Fill #1

## 2019-12-16 ENCOUNTER — Encounter (HOSPITAL_BASED_OUTPATIENT_CLINIC_OR_DEPARTMENT_OTHER): Payer: 59 | Admitting: Cardiology

## 2019-12-23 MED FILL — FLECAINIDE ACETATE 50 MG TA: 50 | 30 days supply | Qty: 60 | Fill #1

## 2019-12-23 MED FILL — VENTOLIN HFA 90 MCG INHALER: 108 (90 BAS | 25 days supply | Qty: 18 | Fill #1

## 2019-12-23 MED FILL — POTASSIUM CHL ER M10 TABLET: 10 | 30 days supply | Qty: 30 | Fill #1

## 2019-12-23 MED FILL — DILTIAZEM HCL 30 MG TABS: 30 | 8 days supply | Qty: 45 | Fill #2

## 2019-12-23 MED FILL — METOPROLOL SUCCINATE ER 25: 25 | 30 days supply | Qty: 30 | Fill #1

## 2019-12-23 MED FILL — MEGESTROL ACET 40 MG/ML SUS: 40 | 12 days supply | Qty: 240 | Fill #2

## 2020-01-11 MED FILL — KETOCONAZOLE 2% CREAM: 2 | 15 days supply | Qty: 30 | Fill #0

## 2020-01-11 MED FILL — BUPRENORPHINE HCL 8 MG SUBL: 8 | 30 days supply | Qty: 60 | Fill #0

## 2020-01-11 MED FILL — MEGESTROL ACET 40 MG/ML SUS: 40 | 12 days supply | Qty: 240 | Fill #2

## 2020-01-31 ENCOUNTER — Emergency Department (HOSPITAL_BASED_OUTPATIENT_CLINIC_OR_DEPARTMENT_OTHER)
Admission: EM | Admit: 2020-01-31 | Discharge: 2020-01-31 | Disposition: A | Discharge status: Home / Self Care | Payer: 59 | Attending: Emergency Medicine | Admitting: Emergency Medicine

## 2020-01-31 ENCOUNTER — Emergency Department (HOSPITAL_BASED_OUTPATIENT_CLINIC_OR_DEPARTMENT_OTHER): Payer: 59

## 2020-01-31 ENCOUNTER — Encounter (HOSPITAL_BASED_OUTPATIENT_CLINIC_OR_DEPARTMENT_OTHER): Payer: Self-pay | Admitting: Emergency Medicine

## 2020-01-31 ENCOUNTER — Other Ambulatory Visit: Payer: Self-pay

## 2020-01-31 DIAGNOSIS — J45909 Unspecified asthma, uncomplicated: Secondary | ICD-10-CM | POA: Diagnosis not present

## 2020-01-31 DIAGNOSIS — F410 Panic disorder [episodic paroxysmal anxiety] without agoraphobia: Secondary | ICD-10-CM | POA: Diagnosis not present

## 2020-01-31 DIAGNOSIS — I480 Paroxysmal atrial fibrillation: Secondary | ICD-10-CM | POA: Diagnosis not present

## 2020-01-31 DIAGNOSIS — Z79899 Other long term (current) drug therapy: Secondary | ICD-10-CM | POA: Diagnosis not present

## 2020-01-31 DIAGNOSIS — Z87891 Personal history of nicotine dependence: Secondary | ICD-10-CM | POA: Insufficient documentation

## 2020-01-31 DIAGNOSIS — R0602 Shortness of breath: Secondary | ICD-10-CM | POA: Diagnosis present

## 2020-01-31 NOTE — ED Triage Notes (Signed)
Pt reports SHOB since about 1am, lightheaded, no chest pain, no N/V, hx of panic attacks

## 2020-01-31 NOTE — ED Notes (Signed)
While attempting IV insertion pt said "nuh uh take that out", after removing needle pt said he was leaving, stated he "didn't have time for this bullsh**" and asked for EKG leads to be removed. After removal pt ambulated to restroom and EDP Dr. Dina Rich was informed of pt status. Pt ambulated off unit, steady gait

## 2020-01-31 NOTE — ED Provider Notes (Signed)
[facility] EMERGENCY DEPARTMENT Provider Note   CSN: [id number] Arrival date & time: 01/31/20  0550     History Chief Complaint  Patient presents with  . Shortness of Breath    Christian Mitchell is a 59 y.o. male.  HPI     This is a 59 year old male with a history of atrial fibrillation, polysubstance abuse, asthma history presents with panic attack.  Patient reports that since 1 AM he has been unable to sleep.  He states that he has woken up frequently unable to catch his breath.  He has felt anxious and stressed.  He states that he has had prior anxiety attacks with similar symptoms.  He states he has been unable to sleep all morning because of his symptoms.  He denies any orthopnea or dyspnea on exertion.  No chest pain or palpitations.  He denies any alcohol or drug use tonight.  Does report increased stressors in his life.  Currently he states that he is just sleepy and is without symptoms.  Past Medical History:  Diagnosis Date  . Asthma   . Atrial fibrillation ([place])   . Chronic back pain   . Chronic neck pain   . GERD (gastroesophageal reflux disease)   . Hepatitis C   . IV drug abuse ([place])   . Kidney stones   . Opiate misuse   . Wears glasses   . Wears partial dentures     Patient Active Problem List   Diagnosis Date Noted  . Paroxysmal atrial fibrillation ([place]) 11/22/2019  . Cervical radiculitis 02/16/2013  . Chronic neck pain 11/02/2012  . Right elbow pain 11/02/2012    Past Surgical History:  Procedure Laterality Date  . [place]   reattachement, left 1st - 4th  . minor laceration repair         Family History  Problem Relation Age of Onset  . Arthritis Mother   . Diabetes Mother   . Cancer Father        prostate  . Arthritis Maternal Grandmother   . Heart disease Maternal Grandmother   . Arthritis Paternal Grandmother   . Stroke Paternal Grandmother   . Diabetes Paternal Grandmother   . Cancer Paternal Grandfather         colon  . Hyperlipidemia Paternal Grandfather   . Hypertension Paternal Grandfather     Social History   Tobacco Use  . Smoking status: Former Smoker    Packs/day: 1.00    Years: 25.00    Pack years: 25.00    Types: Cigarettes  . Smokeless tobacco: Never Used  Substance Use Topics  . Alcohol use: Yes  . Drug use: Not Currently    Types: IV    Home Medications Prior to Admission medications   Medication Sig Start Date End Date Taking? Authorizing Provider  albuterol (PROVENTIL HFA;VENTOLIN HFA) 108 (90 Base) MCG/ACT inhaler INL 1 PUFF PO QID PRN 12/02/18  Yes [provider]  buprenorphine (SUBUTEX) 8 MG SUBL SL tablet  11/04/19  Yes [provider]  diltiazem (CARDIZEM) 30 MG tablet TAKE 1 TABLET BY MOUTH EVERY 4 HOURS AS NEEDED FOR AFIB HEART RATE >100. 11/22/19  Yes Fenton, Clint R, PA  flecainide (TAMBOCOR) 50 MG tablet Take 1 tablet (50 mg total) by mouth 2 (two) times daily. 11/22/19  Yes Fenton, Clint R, PA  megestrol (MEGACE) 40 MG/ML suspension Take by mouth. 11/04/19 11/03/20 Yes [provider]  metoprolol succinate (TOPROL-XL) 25 MG 24 hr tablet  Take 1 tablet (25 mg total) by mouth daily. 11/22/19  Yes Fenton, Clint R, PA  Misc Natural Products (OSTEO BI-FLEX JOINT SHIELD PO) Take by mouth 2 (two) times daily.   Yes [provider]  Multiple Vitamin (ONE-A-DAY MENS PO) Take by mouth daily.   Yes [provider]  Omega-3 1000 MG CAPS Take by mouth 3 (three) times daily.   Yes [provider]  omeprazole (PRILOSEC) 20 MG capsule Take 20 mg by mouth daily.   Yes [provider]  potassium chloride (K-DUR) 10 MEQ tablet Take 1 tablet (10 mEq total) by mouth daily. 02/18/19 01/31/20 Yes Fenton, Clint R, PA  tamsulosin (FLOMAX) 0.4 MG CAPS capsule Take by mouth. 05/15/15  Yes [provider]  meloxicam (MOBIC) 15 MG tablet as needed.    [provider]    Allergies    Other  Review of Systems     Review of Systems  Constitutional: Negative for fever.  Respiratory: Positive for shortness of breath. Negative for cough.   Cardiovascular: Negative for chest pain and palpitations.  Gastrointestinal: Negative for abdominal pain.  Genitourinary: Negative for dysuria.  Psychiatric/Behavioral: The patient is nervous/anxious.   All other systems reviewed and are negative.   Physical Exam Updated Vital Signs BP 135/83 (BP Location: Right Arm)   Pulse 61   Temp 98.5 F (36.9 C) (Oral)   Resp 18   Ht 1.854 m (6\' 1" )   Wt 88.5 kg   SpO2 100%   BMI 25.73 kg/m   Physical Exam Vitals and nursing note reviewed.  Constitutional:      Appearance: He is well-developed. He is not ill-appearing.  HENT:     Head: Normocephalic and atraumatic.  Eyes:     Pupils: Pupils are equal, round, and reactive to light.  Cardiovascular:     Rate and Rhythm: Normal rate and regular rhythm.     Heart sounds: Normal heart sounds. No murmur.  Pulmonary:     Effort: Pulmonary effort is normal. No respiratory distress.     Breath sounds: Normal breath sounds. No wheezing, rhonchi or rales.  Abdominal:     General: Bowel sounds are normal.     Palpations: Abdomen is soft.     Tenderness: There is no abdominal tenderness. There is no rebound.  Musculoskeletal:     Cervical back: Neck supple.     Right lower leg: No tenderness. No edema.     Left lower leg: No tenderness. No edema.  Skin:    General: Skin is warm and dry.  Neurological:     Mental Status: He is alert and oriented to person, place, and time.  Psychiatric:        Mood and Affect: Mood normal.     ED Results / Procedures / Treatments   Labs (all labs ordered are listed, but only abnormal results are displayed) Labs Reviewed  CBC WITH DIFFERENTIAL/PLATELET  BASIC METABOLIC PANEL  TROPONIN I (HIGH SENSITIVITY)    EKG EKG Interpretation  Date/Time:  Monday January 31 2020 06:29:45 EDT Ventricular Rate:  62 PR Interval:     QRS Duration: 118 QT Interval:  411 QTC Calculation: 418 R Axis:   77 Text Interpretation: Sinus rhythm Borderline prolonged PR interval Nonspecific intraventricular conduction delay Confirmed by Thayer Jew [phone]) on 01/31/2020 6:34:20 AM   Radiology DG Chest Portable 1 View  Result Date: 01/31/2020 CLINICAL DATA:  Shortness of breath and anxiety EXAM: PORTABLE CHEST 1 VIEW COMPARISON:  01/06/2019  FINDINGS: Normal heart size and mediastinal contours. No acute infiltrate or edema. No effusion or pneumothorax. No acute osseous findings. Remote left mid clavicle fracture. IMPRESSION: Negative chest. Electronically Signed   By: Monte Fantasia M.D.   On: 01/31/2020 06:36    Procedures Procedures (including critical care time)  Medications Ordered in ED Medications - No data to display  ED Course  I have reviewed the triage vital signs and the nursing notes.  Pertinent labs & imaging results that were available during my care of the patient were reviewed by me and considered in my medical decision making (see chart for details).    MDM Rules/Calculators/A&P                       Patient presents with reported anxiety with his primary symptoms being difficulty catching his breath.  He is currently asymptomatic and states that he is just very sleepy.  He is overall nontoxic and vital signs are reassuring.  He does have a history of atrial fibrillation.  EKG without evidence of acute ischemia or arrhythmia.  Chest x-ray without evidence of pneumothorax or pneumonia.  Unfortunately, patient got upset as nurses were placing an IV and eloped prior to full evaluation.  He was awake, alert, and oriented on my evaluation with normal vital signs otherwise.  Final Clinical Impression(s) / ED Diagnoses Final diagnoses:  Anxiety attack  SOB (shortness of breath)    Rx / DC Orders ED Discharge Orders    None       [place] Grosser, Barbette Hair, MD 01/31/20 [phone]

## 2020-02-01 DIAGNOSIS — F411 Generalized anxiety disorder: Secondary | ICD-10-CM

## 2020-02-01 DIAGNOSIS — F410 Panic disorder [episodic paroxysmal anxiety] without agoraphobia: Secondary | ICD-10-CM

## 2020-02-01 HISTORY — DX: Panic disorder (episodic paroxysmal anxiety): F41.0

## 2020-02-01 MED FILL — VENTOLIN HFA 90 MCG INHALER: 108 (90 BAS | 25 days supply | Qty: 18 | Fill #2

## 2020-02-01 MED FILL — POTASSIUM CHL ER M10 TABLET: 10 | 30 days supply | Qty: 30 | Fill #2

## 2020-02-01 MED FILL — METOPROLOL SUCCINATE ER 25: 25 | 30 days supply | Qty: 30 | Fill #2

## 2020-02-01 MED FILL — FLECAINIDE ACETATE 50 MG TA: 50 | 30 days supply | Qty: 60 | Fill #2

## 2020-02-01 MED FILL — MEGESTROL ACET 40 MG/ML SUS: 40 | 12 days supply | Qty: 240 | Fill #0

## 2020-02-01 MED FILL — SERTRALINE HCL 50 MG TABLET: 50 | 30 days supply | Qty: 30 | Fill #0

## 2020-02-15 ENCOUNTER — Other Ambulatory Visit (HOSPITAL_COMMUNITY): Payer: Self-pay | Admitting: Physician Assistant

## 2020-02-15 MED FILL — DILTIAZEM HCL 30 MG TABS: 30 | 8 days supply | Qty: 45 | Fill #0

## 2020-02-17 DIAGNOSIS — R972 Elevated prostate specific antigen [PSA]: Secondary | ICD-10-CM

## 2020-02-19 DIAGNOSIS — C61 Malignant neoplasm of prostate: Secondary | ICD-10-CM

## 2020-02-19 HISTORY — DX: Malignant neoplasm of prostate: C61

## 2020-02-22 ENCOUNTER — Encounter (HOSPITAL_COMMUNITY): Payer: Self-pay

## 2020-02-22 ENCOUNTER — Ambulatory Visit (HOSPITAL_COMMUNITY): Payer: 59 | Admitting: Physician Assistant

## 2020-03-10 MED FILL — BUPRENORPHINE 8 MG TAB SL: 8 | 30 days supply | Qty: 60 | Fill #0

## 2020-03-10 MED FILL — MEGESTROL ACET 40 MG/ML SUS: 40 | 12 days supply | Qty: 240 | Fill #0

## 2020-03-22 MED FILL — DILTIAZEM HCL 30 MG TABS: 30 | 8 days supply | Qty: 45 | Fill #1

## 2020-03-22 MED FILL — METOPROLOL SUCCINATE ER 25: 25 | 30 days supply | Qty: 30 | Fill #3

## 2020-03-22 MED FILL — SERTRALINE HCL 50 MG TABS: 50 | 30 days supply | Qty: 30 | Fill #0

## 2020-03-22 MED FILL — MEGESTROL ACET 40 MG/ML SUS: 40 | 12 days supply | Qty: 240 | Fill #1

## 2020-03-22 MED FILL — FLECAINIDE ACETATE 50 MG TA: 50 | 30 days supply | Qty: 60 | Fill #3

## 2020-03-22 MED FILL — ALBUTEROL SULFATE HFA 108 (: 108 (90 BAS | 25 days supply | Qty: 9 | Fill #0

## 2020-04-06 MED FILL — MEGESTROL ACET 40 MG/ML SUS: 40 | 12 days supply | Qty: 240 | Fill #2

## 2020-04-07 MED FILL — dilTIAZem HCL 30 MG TABS: 30 | 8 days supply | Qty: 45 | Fill #2

## 2020-04-26 MED FILL — MEGESTROL ACET 40 MG/ML SUS: 40 | 12 days supply | Qty: 240 | Fill #1

## 2020-04-26 MED FILL — SERTRALINE HCL 50 MG TABLET: 50 | 30 days supply | Qty: 30 | Fill #0

## 2020-04-26 MED FILL — METOPROLOL SUCCINATE ER 25: 25 | 30 days supply | Qty: 30 | Fill #4

## 2020-06-21 MED FILL — FLECAINIDE ACETATE 50 MG TA: 50 | 30 days supply | Qty: 60 | Fill #4

## 2020-06-21 MED FILL — MEGESTROL ACET 40 MG/ML SUS: 40 | 12 days supply | Qty: 240 | Fill #2

## 2020-06-22 ENCOUNTER — Other Ambulatory Visit (HOSPITAL_BASED_OUTPATIENT_CLINIC_OR_DEPARTMENT_OTHER): Payer: Self-pay | Admitting: Family Medicine

## 2020-06-22 ENCOUNTER — Other Ambulatory Visit (HOSPITAL_COMMUNITY): Payer: Self-pay | Admitting: Physician Assistant

## 2020-06-22 MED FILL — SERTRALINE HCL 50 MG TABLET: 50 | 30 days supply | Qty: 30 | Fill #0

## 2020-06-22 MED FILL — METOPROLOL SUCCINATE ER 25: 25 | 30 days supply | Qty: 30 | Fill #5

## 2020-06-22 MED FILL — ALBUTEROL SULFATE HFA 108 (: 108 (90 BAS | 25 days supply | Qty: 9 | Fill #0

## 2020-06-22 MED FILL — ADVAIR 250/50 DISKUS: 250-50 | 30 days supply | Qty: 60 | Fill #0

## 2020-06-22 MED FILL — dilTIAZem HCL 30 MG TABS: 30 | 8 days supply | Qty: 45 | Fill #0

## 2020-07-05 DIAGNOSIS — C4362 Malignant melanoma of left upper limb, including shoulder: Secondary | ICD-10-CM

## 2020-07-05 HISTORY — DX: Malignant melanoma of left upper limb, including shoulder: C43.62

## 2020-07-06 DIAGNOSIS — F17200 Nicotine dependence, unspecified, uncomplicated: Secondary | ICD-10-CM | POA: Insufficient documentation

## 2020-07-20 HISTORY — PX: OTHER SURGICAL HISTORY: SHX169

## 2020-07-20 MED FILL — IBUPROFEN 800 MG TAB: 800 | 10 days supply | Qty: 30 | Fill #0

## 2020-07-24 MED FILL — METOPROLOL SUCCINATE ER 25: 25 | 30 days supply | Qty: 30 | Fill #6

## 2020-07-24 MED FILL — SERTRALINE HCL 50 MG TABLET: 50 | 30 days supply | Qty: 30 | Fill #1

## 2020-07-24 MED FILL — FLECAINIDE ACETATE 50 MG TA: 50 | 30 days supply | Qty: 60 | Fill #5

## 2020-08-23 MED FILL — FLECAINIDE ACETATE 50 MG TA: 50 | 30 days supply | Qty: 60 | Fill #6

## 2020-08-23 MED FILL — METOPROLOL SUCCINATE ER 25: 25 | 30 days supply | Qty: 30 | Fill #7

## 2020-08-23 MED FILL — SERTRALINE HCL 50 MG TABLET: 50 | 30 days supply | Qty: 30 | Fill #2

## 2020-09-04 NOTE — Progress Notes (Signed)
Primary Care Physician: Delilah Shan, MD Primary Cardiologist: none Primary Electrophysiologist: none Referring Physician: [name]   Christian Mitchell is a 59 y.o. male with a history of asthma, Hep C, and chronic pain, alcohol use, and paroxysmal atrial fibrillation who presents for follow up in the [facility]. Patient was in his usual state of health until 02/14/19 when he noted heart racing symptoms similar to his previous afib episode. He took PRN diltiazem but became anxious and went to ER. He was in afib with RVR but spontaneously converted after about 30-45 minutes. Patient admits that he had been working hard outside and may have been dehydrated. He also admits to "drinking a few beers" prior to episode. He does admit to snoring and daytime somnolence.   On follow up today, patient reports that he has had frequent episodes of dizziness and presyncope about 3 times per week for the last 2 months. These can occur with standing, activity, or at rest. They can last for up to ~ 2 minutes. It is not similar to any of his afib symptoms in the past. He denies any palpitations.   Today, he denies symptoms of palpitaitons, chest pain, shortness of breath, orthopnea, PND, lower extremity edema, syncope, bleeding, or neurologic sequela. The patient is tolerating medications without difficulties and is otherwise without complaint today.    Atrial Fibrillation Risk Factors:  he does have symptoms of sleep apnea. Sleep study ordered. he does not have a history of rheumatic fever. he does have a history of alcohol use. The patient does not have a history of early familial atrial fibrillation or other arrhythmias.  he has a BMI of Body mass index is 24.78 kg/m.Marland Kitchen Filed Weights   09/05/20 0925  Weight: 85.2 kg    Family History  Problem Relation Age of Onset  . Arthritis Mother   . Diabetes Mother   . Cancer Father        prostate  . Arthritis Maternal  Grandmother   . Heart disease Maternal Grandmother   . Arthritis Paternal Grandmother   . Stroke Paternal Grandmother   . Diabetes Paternal Grandmother   . Cancer Paternal Grandfather        colon  . Hyperlipidemia Paternal Grandfather   . Hypertension Paternal Grandfather      Atrial Fibrillation Management history:  Previous antiarrhythmic drugs: flecainide Previous cardioversions: none Previous ablations: none CHADS2VASC score: 0 Anticoagulation history: Xarelto (stopped)   Past Medical History:  Diagnosis Date  . Asthma   . Atrial fibrillation ([place])   . Chronic back pain   . Chronic neck pain   . GERD (gastroesophageal reflux disease)   . Hepatitis C   . IV drug abuse ([place])   . Kidney stones   . Opiate misuse   . Wears glasses   . Wears partial dentures    Past Surgical History:  Procedure Laterality Date  . [place]   reattachement, left 1st - 4th  . minor laceration repair      Current Outpatient Medications  Medication Sig Dispense Refill  . albuterol (PROVENTIL HFA;VENTOLIN HFA) 108 (90 Base) MCG/ACT inhaler INL 1 PUFF PO QID PRN    . buprenorphine (SUBUTEX) 8 MG SUBL SL tablet     . diltiazem (CARDIZEM) 30 MG tablet TAKE 1 TABLET BY MOUTH EVERY 4 HOURS AS NEEDED FOR AFIB HEART RATE GREATER THAN 100 BPM 45 tablet 0  . flecainide (TAMBOCOR) 50 MG tablet Take  1 tablet (50 mg total) by mouth 2 (two) times daily. 60 tablet 6  . ibuprofen (ADVIL) 800 MG tablet Take 800 mg by mouth every 6 (six) hours as needed.    Marland Kitchen ketoconazole (NIZORAL) 2 % cream Apply topically.    . metoprolol succinate (TOPROL-XL) 25 MG 24 hr tablet Take 1 tablet (25 mg total) by mouth daily. 90 tablet 2  . Multiple Vitamin (ONE-A-DAY MENS PO) Take by mouth daily.    . Omega-3 1000 MG CAPS Take by mouth 3 (three) times daily.    . potassium chloride (K-DUR) 10 MEQ tablet Take 1 tablet (10 mEq total) by mouth daily. 30 tablet 6  . sertraline (ZOLOFT) 50 MG tablet Take 50 mg by  mouth daily.     No current facility-administered medications for this encounter.    No Known Allergies  Social History   Socioeconomic History  . Marital status: Married    Spouse name: Not on file  . Number of children: Not on file  . Years of education: Not on file  . Highest education level: Not on file  Occupational History  . Not on file  Tobacco Use  . Smoking status: Former Smoker    Packs/day: 1.00    Years: 25.00    Pack years: 25.00    Types: Cigarettes  . Smokeless tobacco: Never Used  Substance and Sexual Activity  . Alcohol use: Not Currently  . Drug use: Not Currently    Types: IV  . Sexual activity: Not on file  Other Topics Concern  . Not on file  Social History Narrative   Married for 5 years-fourth marriage   Lives with wife and 3 stepchildren   Grew up in [place], [place]   Social Determinants of Health   Financial Resource Strain:   . Difficulty of Paying Living Expenses: Not on file  Food Insecurity:   . Worried About Charity fundraiser in the Last Year: Not on file  . Ran Out of Food in the Last Year: Not on file  Transportation Needs:   . Lack of Transportation (Medical): Not on file  . Lack of Transportation (Non-Medical): Not on file  Physical Activity:   . Days of Exercise per Week: Not on file  . Minutes of Exercise per Session: Not on file  Stress:   . Feeling of Stress : Not on file  Social Connections:   . Frequency of Communication with Friends and Family: Not on file  . Frequency of Social Gatherings with Friends and Family: Not on file  . Attends Religious Services: Not on file  . Active Member of Clubs or Organizations: Not on file  . Attends Archivist Meetings: Not on file  . Marital Status: Not on file  Intimate Partner Violence:   . Fear of Current or Ex-Partner: Not on file  . Emotionally Abused: Not on file  . Physically Abused: Not on file  . Sexually Abused: Not on file     ROS- All systems are  reviewed and negative except as per the HPI above.  Physical Exam: Vitals:   09/05/20 0925  BP: 108/70  Pulse: 68  Weight: 85.2 kg  Height: 6\' 1"  (1.854 m)    GEN- The patient is well appearing, alert and oriented x 3 today.   HEENT-head normocephalic, atraumatic, sclera clear, conjunctiva pink, hearing intact, trachea midline. Lungs- Clear to ausculation bilaterally, normal work of breathing Heart- Regular rate and rhythm, no murmurs, rubs or gallops  GI- soft, NT, ND, + BS Extremities- no clubbing, cyanosis, or edema MS- no significant deformity or atrophy Skin- no rash or lesion Psych- euthymic mood, full affect Neuro- strength and sensation are intact   Wt Readings from Last 3 Encounters:  09/05/20 85.2 kg  01/31/20 88.5 kg  11/22/19 84.6 kg    EKG today demonstrates SR HR 68, PR 190, QRS 116, QTc 423  Echo 03/23/19 demonstrated   1. The left ventricle has normal systolic function, with an ejection fraction of 55-60%. The cavity size was normal. Left ventricular diastolic parameters were normal.  2. The right ventricle has normal systolic function. The cavity was normal. There is no increase in right ventricular wall thickness.  3. No evidence of mitral valve stenosis.  4. The aortic valve is tricuspid. No stenosis of the aortic valve.  5. The ascending aorta is normal in size and structure.  Epic records are reviewed at length today  Assessment and Plan:  1. Paroxysmal atrial fibrillation Patient appears to be maintaining SR. Continue flecainide 50 mg BID.  Continue metoprolol 25 mg daily Continue diltiazem 30 mg PRN q4hrs  This patients CHA2DS2-VASc Score and unadjusted Ischemic Stroke Rate (% per year) is equal to 0.2 % stroke rate/year from a score of 0  Above score calculated as 1 point each if present [CHF, HTN, DM, Vascular=MI/PAD/Aortic Plaque, Age if 65-74, or Male] Above score calculated as 2 points each if present [Age > 75, or Stroke/TIA/TE]  2.  Dizzy/presyncope Occurs at rest and with activity. No LOC ? Orthostasis Will check 7 day heart monitor to assess for arrhythmogenic cause.    Follow up in the AF clinic in 3 weeks.   [facility] [address] [place], [place] [address] [phone] 09/05/2020 9:30 AM

## 2020-09-05 ENCOUNTER — Ambulatory Visit (HOSPITAL_COMMUNITY)
Admission: RE | Admit: 2020-09-05 | Discharge: 2020-09-05 | Disposition: A | Discharge status: Home / Self Care | Payer: 59 | Source: Ambulatory Visit | Attending: Physician Assistant | Admitting: Physician Assistant

## 2020-09-05 ENCOUNTER — Other Ambulatory Visit: Payer: Self-pay

## 2020-09-05 ENCOUNTER — Encounter (HOSPITAL_COMMUNITY): Payer: Self-pay | Admitting: Physician Assistant

## 2020-09-05 ENCOUNTER — Other Ambulatory Visit (HOSPITAL_COMMUNITY): Payer: Self-pay | Admitting: Physician Assistant

## 2020-09-05 VITALS — BP 108/70 | HR 68 | Ht 73.0 in | Wt 187.8 lb

## 2020-09-05 DIAGNOSIS — Z87891 Personal history of nicotine dependence: Secondary | ICD-10-CM | POA: Diagnosis not present

## 2020-09-05 DIAGNOSIS — R55 Syncope and collapse: Secondary | ICD-10-CM | POA: Insufficient documentation

## 2020-09-05 DIAGNOSIS — Z79899 Other long term (current) drug therapy: Secondary | ICD-10-CM | POA: Diagnosis not present

## 2020-09-05 DIAGNOSIS — R400 Somnolence: Secondary | ICD-10-CM | POA: Insufficient documentation

## 2020-09-05 DIAGNOSIS — J45909 Unspecified asthma, uncomplicated: Secondary | ICD-10-CM | POA: Insufficient documentation

## 2020-09-05 DIAGNOSIS — G8929 Other chronic pain: Secondary | ICD-10-CM | POA: Insufficient documentation

## 2020-09-05 DIAGNOSIS — B1920 Unspecified viral hepatitis C without hepatic coma: Secondary | ICD-10-CM | POA: Diagnosis not present

## 2020-09-05 DIAGNOSIS — Z8249 Family history of ischemic heart disease and other diseases of the circulatory system: Secondary | ICD-10-CM | POA: Insufficient documentation

## 2020-09-05 DIAGNOSIS — R42 Dizziness and giddiness: Secondary | ICD-10-CM

## 2020-09-05 DIAGNOSIS — R0683 Snoring: Secondary | ICD-10-CM | POA: Diagnosis not present

## 2020-09-05 DIAGNOSIS — Z823 Family history of stroke: Secondary | ICD-10-CM | POA: Diagnosis not present

## 2020-09-05 DIAGNOSIS — I480 Paroxysmal atrial fibrillation: Secondary | ICD-10-CM | POA: Diagnosis not present

## 2020-09-05 DIAGNOSIS — Z7289 Other problems related to lifestyle: Secondary | ICD-10-CM | POA: Insufficient documentation

## 2020-09-05 MED ORDER — DILTIAZEM HCL 30 MG PO TABS: ORAL_TABLET | ORAL | 2 refills | Status: DC

## 2020-09-05 MED FILL — dilTIAZem HCL 30 MG TABS: 30 | 7 days supply | Qty: 45 | Fill #0

## 2020-09-25 NOTE — Progress Notes (Incomplete)
Primary Care Physician: Delilah Shan, MD Primary Cardiologist: none Primary Electrophysiologist: none Referring Physician: [name]   Christian Mitchell is a 59 y.o. male with a history of asthma, Hep C, and chronic pain, alcohol use, and paroxysmal atrial fibrillation who presents for follow up in the [facility]. Patient was in his usual state of health until 02/14/19 when he noted heart racing symptoms similar to his previous afib episode. He took PRN diltiazem but became anxious and went to ER. He was in afib with RVR but spontaneously converted after about 30-45 minutes. Patient admits that he had been working hard outside and may have been dehydrated. He also admits to "drinking a few beers" prior to episode. He does admit to snoring and daytime somnolence.   On follow up today, ***   Today, he denies symptoms of ***palpitaitons, chest pain, shortness of breath, orthopnea, PND, lower extremity edema, syncope, bleeding, or neurologic sequela. The patient is tolerating medications without difficulties and is otherwise without complaint today.    Atrial Fibrillation Risk Factors:  he does have symptoms of sleep apnea. Sleep study ordered. he does not have a history of rheumatic fever. he does have a history of alcohol use. The patient does not have a history of early familial atrial fibrillation or other arrhythmias.  he has a BMI of There is no height or weight on file to calculate BMI.. There were no vitals filed for this visit.  Family History  Problem Relation Age of Onset  . Arthritis Mother   . Diabetes Mother   . Cancer Father        prostate  . Arthritis Maternal Grandmother   . Heart disease Maternal Grandmother   . Arthritis Paternal Grandmother   . Stroke Paternal Grandmother   . Diabetes Paternal Grandmother   . Cancer Paternal Grandfather        colon  . Hyperlipidemia Paternal Grandfather   . Hypertension Paternal  Grandfather      Atrial Fibrillation Management history:  Previous antiarrhythmic drugs: flecainide Previous cardioversions: none Previous ablations: none CHADS2VASC score: 0 Anticoagulation history: Xarelto (stopped)   Past Medical History:  Diagnosis Date  . Asthma   . Atrial fibrillation ([place])   . Chronic back pain   . Chronic neck pain   . GERD (gastroesophageal reflux disease)   . Hepatitis C   . IV drug abuse ([place])   . Kidney stones   . Opiate misuse   . Wears glasses   . Wears partial dentures    Past Surgical History:  Procedure Laterality Date  . [place]   reattachement, left 1st - 4th  . minor laceration repair      Current Outpatient Medications  Medication Sig Dispense Refill  . albuterol (PROVENTIL HFA;VENTOLIN HFA) 108 (90 Base) MCG/ACT inhaler INL 1 PUFF PO QID PRN    . buprenorphine (SUBUTEX) 8 MG SUBL SL tablet     . diltiazem (CARDIZEM) 30 MG tablet Take 1 tablet every 4 hours AS NEEDED for afib heart rate >100 45 tablet 2  . flecainide (TAMBOCOR) 50 MG tablet Take 1 tablet (50 mg total) by mouth 2 (two) times daily. 60 tablet 6  . ibuprofen (ADVIL) 800 MG tablet Take 800 mg by mouth every 6 (six) hours as needed.    Marland Kitchen ketoconazole (NIZORAL) 2 % cream Apply topically.    . metoprolol succinate (TOPROL-XL) 25 MG 24 hr tablet Take 1 tablet (25 mg  total) by mouth daily. 90 tablet 2  . Multiple Vitamin (ONE-A-DAY MENS PO) Take by mouth daily.    . Omega-3 1000 MG CAPS Take by mouth 3 (three) times daily.    . potassium chloride (K-DUR) 10 MEQ tablet Take 1 tablet (10 mEq total) by mouth daily. 30 tablet 6  . sertraline (ZOLOFT) 50 MG tablet Take 50 mg by mouth daily.     No current facility-administered medications for this visit.    No Known Allergies  Social History   Socioeconomic History  . Marital status: Married    Spouse name: Not on file  . Number of children: Not on file  . Years of education: Not on file  . Highest  education level: Not on file  Occupational History  . Not on file  Tobacco Use  . Smoking status: Former Smoker    Packs/day: 1.00    Years: 25.00    Pack years: 25.00    Types: Cigarettes  . Smokeless tobacco: Never Used  Substance and Sexual Activity  . Alcohol use: Not Currently  . Drug use: Not Currently    Types: IV  . Sexual activity: Not on file  Other Topics Concern  . Not on file  Social History Narrative   Married for 5 years-fourth marriage   Lives with wife and 3 stepchildren   Grew up in [place], [place]   Social Determinants of Health   Financial Resource Strain:   . Difficulty of Paying Living Expenses: Not on file  Food Insecurity:   . Worried About Charity fundraiser in the Last Year: Not on file  . Ran Out of Food in the Last Year: Not on file  Transportation Needs:   . Lack of Transportation (Medical): Not on file  . Lack of Transportation (Non-Medical): Not on file  Physical Activity:   . Days of Exercise per Week: Not on file  . Minutes of Exercise per Session: Not on file  Stress:   . Feeling of Stress : Not on file  Social Connections:   . Frequency of Communication with Friends and Family: Not on file  . Frequency of Social Gatherings with Friends and Family: Not on file  . Attends Religious Services: Not on file  . Active Member of Clubs or Organizations: Not on file  . Attends Archivist Meetings: Not on file  . Marital Status: Not on file  Intimate Partner Violence:   . Fear of Current or Ex-Partner: Not on file  . Emotionally Abused: Not on file  . Physically Abused: Not on file  . Sexually Abused: Not on file     ROS- All systems are reviewed and negative except as per the HPI above.  Physical Exam: There were no vitals filed for this visit.  GEN- The patient is well appearing, alert and oriented x 3 today.   HEENT-head normocephalic, atraumatic, sclera clear, conjunctiva pink, hearing intact, trachea midline. Lungs-  Clear to ausculation bilaterally, normal work of breathing Heart- ***Regular rate and rhythm, no murmurs, rubs or gallops  GI- soft, NT, ND, + BS Extremities- no clubbing, cyanosis, or edema MS- no significant deformity or atrophy Skin- no rash or lesion Psych- euthymic mood, full affect Neuro- strength and sensation are intact   Wt Readings from Last 3 Encounters:  09/05/20 85.2 kg  01/31/20 88.5 kg  11/22/19 84.6 kg    EKG today demonstrates ***  Echo 03/23/19 demonstrated   1. The left ventricle has normal systolic  function, with an ejection fraction of 55-60%. The cavity size was normal. Left ventricular diastolic parameters were normal.  2. The right ventricle has normal systolic function. The cavity was normal. There is no increase in right ventricular wall thickness.  3. No evidence of mitral valve stenosis.  4. The aortic valve is tricuspid. No stenosis of the aortic valve.  5. The ascending aorta is normal in size and structure.  Epic records are reviewed at length today  Assessment and Plan:  1. Paroxysmal atrial fibrillation *** Continue flecainide 50 mg BID.  Continue metoprolol 25 mg daily Continue diltiazem 30 mg PRN q4hrs  This patients CHA2DS2-VASc Score and unadjusted Ischemic Stroke Rate (% per year) is equal to 0.2 % stroke rate/year from a score of 0  Above score calculated as 1 point each if present [CHF, HTN, DM, Vascular=MI/PAD/Aortic Plaque, Age if 65-74, or Male] Above score calculated as 2 points each if present [Age > 75, or Stroke/TIA/TE]  2. Dizzy/presyncope ***Occurs at rest and with activity. No LOC ? Orthostasis Will check 7 day heart monitor to assess for arrhythmogenic cause.    Follow up ***   Adline Peals PA-C [facility] [address] [place], [place] [address] [phone] 09/25/2020 4:01 PM

## 2020-09-26 ENCOUNTER — Encounter (HOSPITAL_COMMUNITY): Payer: Self-pay

## 2020-09-26 ENCOUNTER — Ambulatory Visit (HOSPITAL_COMMUNITY): Payer: 59 | Admitting: Physician Assistant

## 2020-10-03 ENCOUNTER — Other Ambulatory Visit (HOSPITAL_COMMUNITY): Payer: Self-pay | Admitting: Physician Assistant

## 2020-10-03 MED FILL — METOPROLOL SUCCINATE ER 25: 25 | 30 days supply | Qty: 30 | Fill #8

## 2020-10-03 MED FILL — FLECAINIDE ACETATE 50 MG TA: 50 | 30 days supply | Qty: 60 | Fill #0

## 2020-10-03 MED FILL — SERTRALINE HCL 50 MG TABLET: 50 | 30 days supply | Qty: 30 | Fill #3

## 2020-10-03 MED FILL — dilTIAZem HCL 30 MG TABS: 30 | 8 days supply | Qty: 45 | Fill #0

## 2020-10-18 ENCOUNTER — Encounter (HOSPITAL_COMMUNITY): Payer: Self-pay | Admitting: *Deleted

## 2020-11-07 ENCOUNTER — Other Ambulatory Visit (HOSPITAL_COMMUNITY): Payer: Self-pay | Admitting: Physician Assistant

## 2020-11-07 MED FILL — dilTIAZem HCL 30 MG TABS: 30 | 8 days supply | Qty: 45 | Fill #1

## 2020-11-07 MED FILL — SERTRALINE HCL 50 MG TABLET: 50 | 60 days supply | Qty: 60 | Fill #4

## 2020-11-07 MED FILL — FLECAINIDE ACETATE 50 MG TA: 50 | 30 days supply | Qty: 60 | Fill #0

## 2020-11-07 MED FILL — METOPROLOL SUCCINATE ER 25: 25 | 30 days supply | Qty: 30 | Fill #0

## 2020-12-22 ENCOUNTER — Other Ambulatory Visit (HOSPITAL_COMMUNITY): Payer: Self-pay | Admitting: Physician Assistant

## 2020-12-22 MED FILL — dilTIAZem HCL 30 MG TABS: 30 | 8 days supply | Qty: 45 | Fill #2

## 2020-12-27 ENCOUNTER — Ambulatory Visit (HOSPITAL_COMMUNITY)
Admission: RE | Admit: 2020-12-27 | Discharge: 2020-12-27 | Disposition: A | Discharge status: Home / Self Care | Payer: 59 | Source: Ambulatory Visit | Attending: Physician Assistant | Admitting: Physician Assistant

## 2020-12-27 ENCOUNTER — Other Ambulatory Visit (HOSPITAL_COMMUNITY): Payer: Self-pay | Admitting: Physician Assistant

## 2020-12-27 ENCOUNTER — Encounter (HOSPITAL_COMMUNITY): Payer: Self-pay | Admitting: Physician Assistant

## 2020-12-27 ENCOUNTER — Other Ambulatory Visit: Payer: Self-pay

## 2020-12-27 VITALS — BP 124/84 | HR 68 | Ht 73.0 in | Wt 179.6 lb

## 2020-12-27 DIAGNOSIS — R42 Dizziness and giddiness: Secondary | ICD-10-CM | POA: Diagnosis not present

## 2020-12-27 DIAGNOSIS — I480 Paroxysmal atrial fibrillation: Secondary | ICD-10-CM | POA: Diagnosis not present

## 2020-12-27 DIAGNOSIS — Z87891 Personal history of nicotine dependence: Secondary | ICD-10-CM | POA: Insufficient documentation

## 2020-12-27 MED ORDER — METOPROLOL SUCCINATE ER 25 MG PO TB24: 25.0000 mg | ORAL_TABLET | Freq: Every day | ORAL | 2 refills | Status: DC

## 2020-12-27 MED ORDER — FLECAINIDE ACETATE 50 MG PO TABS: ORAL_TABLET | ORAL | 2 refills | Status: DC

## 2020-12-27 MED FILL — METOPROLOL SUCCINATE ER 25: 25 | 90 days supply | Qty: 90 | Fill #0

## 2020-12-27 MED FILL — FLECAINIDE ACETATE 50 MG TA: 50 | 90 days supply | Qty: 180 | Fill #0

## 2020-12-27 NOTE — Progress Notes (Signed)
Primary Care Physician: Delilah Shan, MD Primary Cardiologist: none Primary Electrophysiologist: none Referring Physician: [name]   Christian Mitchell is a 60 y.o. male with a history of asthma, Hep C, and chronic pain, alcohol use, and paroxysmal atrial fibrillation who presents for follow up in the [facility]. Patient was in his usual state of health until 02/14/19 when he noted heart racing symptoms similar to his previous afib episode. He took PRN diltiazem but became anxious and went to ER. He was in afib with RVR but spontaneously converted after about 30-45 minutes. Patient admits that he had been working hard outside and may have been dehydrated. He also admits to "drinking a few beers" prior to episode. He does admit to snoring and daytime somnolence.   On follow up today, patient reports that he has done well since his last visit. His symptoms of dizziness have resolved. Zio patch did not show any pauses or sustained arrhythmias. He denies heart racing or palpitations.   Today, he denies symptoms of palpitaitons, chest pain, shortness of breath, orthopnea, PND, lower extremity edema, dizziness, syncope, bleeding, or neurologic sequela. The patient is tolerating medications without difficulties and is otherwise without complaint today.    Atrial Fibrillation Risk Factors:  he does have symptoms of sleep apnea. Sleep study ordered. he does not have a history of rheumatic fever. he does have a history of alcohol use. The patient does not have a history of early familial atrial fibrillation or other arrhythmias.  he has a BMI of Body mass index is 23.7 kg/m.Marland Kitchen Filed Weights   12/27/20 1538  Weight: 81.5 kg    Family History  Problem Relation Age of Onset  . Arthritis Mother   . Diabetes Mother   . Cancer Father        prostate  . Arthritis Maternal Grandmother   . Heart disease Maternal Grandmother   . Arthritis Paternal Grandmother    . Stroke Paternal Grandmother   . Diabetes Paternal Grandmother   . Cancer Paternal Grandfather        colon  . Hyperlipidemia Paternal Grandfather   . Hypertension Paternal Grandfather      Atrial Fibrillation Management history:  Previous antiarrhythmic drugs: flecainide Previous cardioversions: none Previous ablations: none CHADS2VASC score: 0 Anticoagulation history: Xarelto (stopped)   Past Medical History:  Diagnosis Date  . Asthma   . Atrial fibrillation ([place])   . Chronic back pain   . Chronic neck pain   . GERD (gastroesophageal reflux disease)   . Hepatitis C   . IV drug abuse ([place])   . Kidney stones   . Opiate misuse   . Wears glasses   . Wears partial dentures    Past Surgical History:  Procedure Laterality Date  . [place]   reattachement, left 1st - 4th  . minor laceration repair      Current Outpatient Medications  Medication Sig Dispense Refill  . buprenorphine (SUBUTEX) 8 MG SUBL SL tablet     . diltiazem (CARDIZEM) 30 MG tablet Take 1 tablet every 4 hours AS NEEDED for afib heart rate >100 45 tablet 2  . ibuprofen (ADVIL) 800 MG tablet Take 800 mg by mouth every 6 (six) hours as needed.    Marland Kitchen ketoconazole (NIZORAL) 2 % cream Apply topically.    . Multiple Vitamin (ONE-A-DAY MENS PO) Take by mouth daily.    . Omega-3 1000 MG CAPS Take by mouth 3 (three)  times daily.    Marland Kitchen omeprazole (PRILOSEC) 40 MG capsule Take 1 capsule by mouth daily.    . sertraline (ZOLOFT) 50 MG tablet Take 50 mg by mouth daily.    . flecainide (TAMBOCOR) 50 MG tablet TAKE 1 TABLET BY MOUTH TWICE DAILY 180 tablet 2  . metoprolol succinate (TOPROL-XL) 25 MG 24 hr tablet Take 1 tablet (25 mg total) by mouth daily. 90 tablet 2  . potassium chloride (K-DUR) 10 MEQ tablet Take 1 tablet (10 mEq total) by mouth daily. 30 tablet 6   No current facility-administered medications for this encounter.    No Known Allergies  Social History   Socioeconomic History  .  Marital status: Married    Spouse name: Not on file  . Number of children: Not on file  . Years of education: Not on file  . Highest education level: Not on file  Occupational History  . Not on file  Tobacco Use  . Smoking status: Former Smoker    Packs/day: 1.00    Years: 25.00    Pack years: 25.00    Types: Cigarettes  . Smokeless tobacco: Never Used  Substance and Sexual Activity  . Alcohol use: Yes    Alcohol/week: 1.0 standard drink    Types: 1 Cans of beer per week  . Drug use: Not Currently    Types: IV  . Sexual activity: Not on file  Other Topics Concern  . Not on file  Social History Narrative   Married for 5 years-fourth marriage   Lives with wife and 3 stepchildren   Grew up in [place], [place]   Social Determinants of Health   Financial Resource Strain: Not on file  Food Insecurity: Not on file  Transportation Needs: Not on file  Physical Activity: Not on file  Stress: Not on file  Social Connections: Not on file  Intimate Partner Violence: Not on file     ROS- All systems are reviewed and negative except as per the HPI above.  Physical Exam: Vitals:   12/27/20 1538  BP: 124/84  Pulse: 68  Weight: 81.5 kg  Height: 6\' 1"  (1.854 m)   GEN- The patient is a well appearing male, alert and oriented x 3 today.   HEENT-head normocephalic, atraumatic, sclera clear, conjunctiva pink, hearing intact, trachea midline. Lungs- Clear to ausculation bilaterally, normal work of breathing Heart- Regular rate and rhythm, no murmurs, rubs or gallops  GI- soft, NT, ND, + BS Extremities- no clubbing, cyanosis, or edema MS- no significant deformity or atrophy Skin- no rash or lesion Psych- euthymic mood, full affect Neuro- strength and sensation are intact   Wt Readings from Last 3 Encounters:  12/27/20 81.5 kg  09/05/20 85.2 kg  01/31/20 88.5 kg    EKG today demonstrates  SR Vent. rate 68 BPM PR interval 188 ms QRS duration 120 ms QT/QTc 420/446  ms  Echo 03/23/19 demonstrated   1. The left ventricle has normal systolic function, with an ejection fraction of 55-60%. The cavity size was normal. Left ventricular diastolic parameters were normal.  2. The right ventricle has normal systolic function. The cavity was normal. There is no increase in right ventricular wall thickness.  3. No evidence of mitral valve stenosis.  4. The aortic valve is tricuspid. No stenosis of the aortic valve.  5. The ascending aorta is normal in size and structure.  Epic records are reviewed at length today  Assessment and Plan:  1. Paroxysmal atrial fibrillation Patient appears to  be maintaining SR.  Continue flecainide 50 mg BID, intervals appropriate. Continue metoprolol 25 mg daily Continue diltiazem 30 mg PRN q4hrs  This patients CHA2DS2-VASc Score and unadjusted Ischemic Stroke Rate (% per year) is equal to 0.2 % stroke rate/year from a score of 0  Above score calculated as 1 point each if present [CHF, HTN, DM, Vascular=MI/PAD/Aortic Plaque, Age if 65-74, or Male] Above score calculated as 2 points each if present [Age > 75, or Stroke/TIA/TE]  2. Dizzy/presyncope Zio showed no afib, pauses, or sustained arrhythmias.  Symptoms have resolved.    Follow up in the AF clinic in 6 months.    [facility] [address] [place], [place] [address] [phone] 12/27/2020 3:56 PM

## 2020-12-29 ENCOUNTER — Other Ambulatory Visit (HOSPITAL_COMMUNITY): Payer: Self-pay | Admitting: Physician Assistant

## 2020-12-29 ENCOUNTER — Other Ambulatory Visit (HOSPITAL_BASED_OUTPATIENT_CLINIC_OR_DEPARTMENT_OTHER): Payer: Self-pay | Admitting: Family Medicine

## 2020-12-29 MED FILL — SERTRALINE HCL 50 MG TABS: 50 | 30 days supply | Qty: 30 | Fill #0

## 2021-01-17 MED FILL — SERTRALINE HCL 50 MG TABS: 50 | 30 days supply | Qty: 30 | Fill #0

## 2021-01-20 ENCOUNTER — Other Ambulatory Visit (HOSPITAL_BASED_OUTPATIENT_CLINIC_OR_DEPARTMENT_OTHER): Payer: Self-pay

## 2021-01-21 ENCOUNTER — Other Ambulatory Visit (HOSPITAL_BASED_OUTPATIENT_CLINIC_OR_DEPARTMENT_OTHER): Payer: Self-pay

## 2021-01-21 MED ORDER — ALBUTEROL SULFATE HFA 108 (90 BASE) MCG/ACT IN AERS
2.0000 | INHALATION_SPRAY | Freq: Four times a day (QID) | RESPIRATORY_TRACT | 3 refills | Status: DC | PRN
  Filled 2021-01-21: qty 18, 25d supply, fill #0
  Filled 2021-02-26: qty 18, 30d supply, fill #0
  Filled 2021-06-20: qty 18, 25d supply, fill #1
  Filled 2021-07-02: qty 18, 30d supply, fill #1
  Filled 2021-07-26 – 2022-01-10 (×2): qty 18, 30d supply, fill #2

## 2021-01-29 ENCOUNTER — Other Ambulatory Visit (HOSPITAL_BASED_OUTPATIENT_CLINIC_OR_DEPARTMENT_OTHER): Payer: Self-pay

## 2021-02-26 ENCOUNTER — Other Ambulatory Visit (HOSPITAL_BASED_OUTPATIENT_CLINIC_OR_DEPARTMENT_OTHER): Payer: Self-pay

## 2021-02-27 ENCOUNTER — Other Ambulatory Visit (HOSPITAL_BASED_OUTPATIENT_CLINIC_OR_DEPARTMENT_OTHER): Payer: Self-pay

## 2021-02-28 ENCOUNTER — Other Ambulatory Visit (HOSPITAL_BASED_OUTPATIENT_CLINIC_OR_DEPARTMENT_OTHER): Payer: Self-pay

## 2021-03-02 ENCOUNTER — Other Ambulatory Visit (HOSPITAL_BASED_OUTPATIENT_CLINIC_OR_DEPARTMENT_OTHER): Payer: Self-pay

## 2021-03-05 ENCOUNTER — Other Ambulatory Visit (HOSPITAL_BASED_OUTPATIENT_CLINIC_OR_DEPARTMENT_OTHER): Payer: Self-pay

## 2021-03-06 ENCOUNTER — Other Ambulatory Visit (HOSPITAL_BASED_OUTPATIENT_CLINIC_OR_DEPARTMENT_OTHER): Payer: Self-pay

## 2021-03-06 MED ORDER — SERTRALINE HCL 50 MG PO TABS
1.0000 | ORAL_TABLET | Freq: Every day | ORAL | 0 refills | Status: DC
  Filled 2021-03-06: qty 30, 30d supply, fill #0

## 2021-03-27 ENCOUNTER — Other Ambulatory Visit (HOSPITAL_BASED_OUTPATIENT_CLINIC_OR_DEPARTMENT_OTHER): Payer: Self-pay

## 2021-03-27 MED FILL — Metoprolol Succinate Tab ER 24HR 25 MG (Tartrate Equiv): ORAL | 90 days supply | Qty: 90 | Fill #0 | Status: AC

## 2021-03-27 MED FILL — Flecainide Acetate Tab 50 MG: ORAL | 90 days supply | Qty: 180 | Fill #0 | Status: AC

## 2021-03-27 MED FILL — Diltiazem HCl Tab 30 MG: ORAL | 8 days supply | Qty: 45 | Fill #0 | Status: AC

## 2021-03-28 ENCOUNTER — Other Ambulatory Visit (HOSPITAL_BASED_OUTPATIENT_CLINIC_OR_DEPARTMENT_OTHER): Payer: Self-pay

## 2021-06-20 ENCOUNTER — Other Ambulatory Visit (HOSPITAL_BASED_OUTPATIENT_CLINIC_OR_DEPARTMENT_OTHER): Payer: Self-pay

## 2021-06-20 MED ORDER — ALBUTEROL SULFATE HFA 108 (90 BASE) MCG/ACT IN AERS
INHALATION_SPRAY | RESPIRATORY_TRACT | 0 refills | Status: DC
  Filled 2021-11-29: qty 18, 30d supply, fill #0

## 2021-06-20 MED ORDER — TAMSULOSIN HCL 0.4 MG PO CAPS
ORAL_CAPSULE | ORAL | 0 refills | Status: DC
  Filled 2021-06-20 – 2021-07-02 (×2): qty 30, 30d supply, fill #0

## 2021-06-29 ENCOUNTER — Other Ambulatory Visit (HOSPITAL_BASED_OUTPATIENT_CLINIC_OR_DEPARTMENT_OTHER): Payer: Self-pay

## 2021-07-02 ENCOUNTER — Other Ambulatory Visit (HOSPITAL_BASED_OUTPATIENT_CLINIC_OR_DEPARTMENT_OTHER): Payer: Self-pay

## 2021-07-26 ENCOUNTER — Other Ambulatory Visit (HOSPITAL_BASED_OUTPATIENT_CLINIC_OR_DEPARTMENT_OTHER): Payer: Self-pay

## 2021-07-26 MED ORDER — TAMSULOSIN HCL 0.4 MG PO CAPS
0.4000 mg | ORAL_CAPSULE | Freq: Every day | ORAL | 0 refills | Status: DC
  Filled 2021-07-26 – 2021-08-08 (×2): qty 30, 30d supply, fill #0

## 2021-07-26 MED FILL — Metoprolol Succinate Tab ER 24HR 25 MG (Tartrate Equiv): ORAL | 90 days supply | Qty: 90 | Fill #1 | Status: CN

## 2021-07-26 MED FILL — Diltiazem HCl Tab 30 MG: ORAL | 8 days supply | Qty: 45 | Fill #1 | Status: CN

## 2021-08-03 ENCOUNTER — Other Ambulatory Visit (HOSPITAL_BASED_OUTPATIENT_CLINIC_OR_DEPARTMENT_OTHER): Payer: Self-pay

## 2021-08-08 ENCOUNTER — Other Ambulatory Visit (HOSPITAL_BASED_OUTPATIENT_CLINIC_OR_DEPARTMENT_OTHER): Payer: Self-pay

## 2021-08-08 MED FILL — Flecainide Acetate Tab 50 MG: ORAL | 90 days supply | Qty: 180 | Fill #1 | Status: AC

## 2021-08-08 MED FILL — Diltiazem HCl Tab 30 MG: ORAL | 8 days supply | Qty: 45 | Fill #1 | Status: AC

## 2021-08-08 MED FILL — Metoprolol Succinate Tab ER 24HR 25 MG (Tartrate Equiv): ORAL | 90 days supply | Qty: 90 | Fill #1 | Status: AC

## 2021-08-09 ENCOUNTER — Other Ambulatory Visit (HOSPITAL_BASED_OUTPATIENT_CLINIC_OR_DEPARTMENT_OTHER): Payer: Self-pay

## 2021-08-09 MED ORDER — SERTRALINE HCL 50 MG PO TABS
50.0000 mg | ORAL_TABLET | Freq: Every day | ORAL | 3 refills | Status: DC
  Filled 2021-08-09: qty 90, 90d supply, fill #0
  Filled 2021-10-29 – 2021-11-09 (×2): qty 30, 30d supply, fill #0
  Filled 2021-12-03: qty 30, 30d supply, fill #1
  Filled 2022-01-10: qty 30, 30d supply, fill #2
  Filled 2022-01-24 – 2022-02-04 (×2): qty 30, 30d supply, fill #3
  Filled 2022-03-07: qty 30, 30d supply, fill #4
  Filled 2022-04-09: qty 30, 30d supply, fill #5
  Filled 2022-05-08: qty 30, 30d supply, fill #6
  Filled 2022-06-11: qty 30, 30d supply, fill #7
  Filled 2022-07-24: qty 30, 30d supply, fill #8

## 2021-08-10 ENCOUNTER — Other Ambulatory Visit (HOSPITAL_BASED_OUTPATIENT_CLINIC_OR_DEPARTMENT_OTHER): Payer: Self-pay

## 2021-08-20 ENCOUNTER — Other Ambulatory Visit (HOSPITAL_BASED_OUTPATIENT_CLINIC_OR_DEPARTMENT_OTHER): Payer: Self-pay

## 2021-10-29 ENCOUNTER — Other Ambulatory Visit (HOSPITAL_COMMUNITY): Payer: Self-pay | Admitting: Physician Assistant

## 2021-10-29 ENCOUNTER — Other Ambulatory Visit (HOSPITAL_BASED_OUTPATIENT_CLINIC_OR_DEPARTMENT_OTHER): Payer: Self-pay

## 2021-10-29 MED ORDER — TAMSULOSIN HCL 0.4 MG PO CAPS
0.4000 mg | ORAL_CAPSULE | Freq: Every day | ORAL | 0 refills | Status: DC
  Filled 2021-10-29 – 2021-11-09 (×2): qty 30, 30d supply, fill #0

## 2021-10-29 MED ORDER — METOPROLOL SUCCINATE ER 25 MG PO TB24
25.0000 mg | ORAL_TABLET | Freq: Every day | ORAL | 0 refills | Status: DC
  Filled 2021-10-29 – 2021-11-09 (×2): qty 30, 30d supply, fill #0

## 2021-11-05 ENCOUNTER — Other Ambulatory Visit (HOSPITAL_BASED_OUTPATIENT_CLINIC_OR_DEPARTMENT_OTHER): Payer: Self-pay

## 2021-11-09 ENCOUNTER — Other Ambulatory Visit (HOSPITAL_BASED_OUTPATIENT_CLINIC_OR_DEPARTMENT_OTHER): Payer: Self-pay

## 2021-11-12 ENCOUNTER — Other Ambulatory Visit (HOSPITAL_BASED_OUTPATIENT_CLINIC_OR_DEPARTMENT_OTHER): Payer: Self-pay

## 2021-11-12 DIAGNOSIS — Z8546 Personal history of malignant neoplasm of prostate: Secondary | ICD-10-CM | POA: Diagnosis not present

## 2021-11-12 DIAGNOSIS — R630 Anorexia: Secondary | ICD-10-CM | POA: Diagnosis not present

## 2021-11-12 DIAGNOSIS — R894 Abnormal immunological findings in specimens from other organs, systems and tissues: Secondary | ICD-10-CM | POA: Diagnosis not present

## 2021-11-12 MED ORDER — MEGESTROL ACETATE 40 MG PO TABS
40.0000 mg | ORAL_TABLET | Freq: Every day | ORAL | 3 refills | Status: DC
  Filled 2021-11-12: qty 30, 30d supply, fill #0
  Filled 2021-12-07: qty 30, 30d supply, fill #1
  Filled 2022-01-10: qty 30, 30d supply, fill #2
  Filled 2022-01-24: qty 30, 30d supply, fill #3
  Filled 2022-02-04: qty 30, 15d supply, fill #3

## 2021-11-13 ENCOUNTER — Other Ambulatory Visit (HOSPITAL_BASED_OUTPATIENT_CLINIC_OR_DEPARTMENT_OTHER): Payer: Self-pay

## 2021-11-28 ENCOUNTER — Other Ambulatory Visit: Payer: Self-pay | Admitting: Urology

## 2021-11-28 DIAGNOSIS — C61 Malignant neoplasm of prostate: Secondary | ICD-10-CM

## 2021-11-29 ENCOUNTER — Other Ambulatory Visit (HOSPITAL_COMMUNITY): Payer: Self-pay | Admitting: Physician Assistant

## 2021-11-29 ENCOUNTER — Other Ambulatory Visit (HOSPITAL_BASED_OUTPATIENT_CLINIC_OR_DEPARTMENT_OTHER): Payer: Self-pay

## 2021-11-29 DIAGNOSIS — R079 Chest pain, unspecified: Secondary | ICD-10-CM | POA: Diagnosis not present

## 2021-11-29 MED ORDER — METOPROLOL SUCCINATE ER 25 MG PO TB24
25.0000 mg | ORAL_TABLET | Freq: Every day | ORAL | 0 refills | Status: DC
  Filled 2021-11-29 – 2021-12-07 (×3): qty 30, 30d supply, fill #0

## 2021-11-29 MED ORDER — FLECAINIDE ACETATE 50 MG PO TABS
50.0000 mg | ORAL_TABLET | Freq: Two times a day (BID) | ORAL | 0 refills | Status: DC
  Filled 2021-11-29: qty 60, 30d supply, fill #0

## 2021-11-29 MED ORDER — KETOCONAZOLE 2 % EX CREA
TOPICAL_CREAM | CUTANEOUS | 0 refills | Status: DC
  Filled 2021-11-29: qty 15, 14d supply, fill #0

## 2021-11-29 MED ORDER — TAMSULOSIN HCL 0.4 MG PO CAPS
0.4000 mg | ORAL_CAPSULE | Freq: Every day | ORAL | 1 refills | Status: DC
  Filled 2021-11-29 – 2021-12-07 (×3): qty 30, 30d supply, fill #0
  Filled 2022-01-10: qty 30, 30d supply, fill #1

## 2021-11-29 MED ORDER — IBUPROFEN 800 MG PO TABS
800.0000 mg | ORAL_TABLET | Freq: Three times a day (TID) | ORAL | 0 refills | Status: DC | PRN
  Filled 2021-11-29: qty 21, 7d supply, fill #0

## 2021-11-29 MED ORDER — CYCLOBENZAPRINE HCL 10 MG PO TABS
10.0000 mg | ORAL_TABLET | Freq: Every evening | ORAL | 1 refills | Status: DC | PRN
  Filled 2021-11-29: qty 20, 20d supply, fill #0
  Filled 2022-03-07: qty 20, 20d supply, fill #1

## 2021-12-03 ENCOUNTER — Other Ambulatory Visit (HOSPITAL_BASED_OUTPATIENT_CLINIC_OR_DEPARTMENT_OTHER): Payer: Self-pay

## 2021-12-07 ENCOUNTER — Other Ambulatory Visit (HOSPITAL_BASED_OUTPATIENT_CLINIC_OR_DEPARTMENT_OTHER): Payer: Self-pay

## 2021-12-16 ENCOUNTER — Ambulatory Visit
Admission: RE | Admit: 2021-12-16 | Discharge: 2021-12-16 | Disposition: A | Discharge status: Home / Self Care | Payer: Self-pay | Source: Ambulatory Visit | Attending: Urology | Admitting: Urology

## 2021-12-16 ENCOUNTER — Other Ambulatory Visit: Payer: Self-pay | Admitting: Urology

## 2021-12-16 ENCOUNTER — Ambulatory Visit
Admission: RE | Admit: 2021-12-16 | Discharge: 2021-12-16 | Disposition: A | Discharge status: Home / Self Care | Payer: 59 | Source: Ambulatory Visit | Attending: Urology | Admitting: Urology

## 2021-12-16 ENCOUNTER — Other Ambulatory Visit: Payer: Self-pay

## 2021-12-16 DIAGNOSIS — Z77018 Contact with and (suspected) exposure to other hazardous metals: Secondary | ICD-10-CM

## 2021-12-16 DIAGNOSIS — Z01818 Encounter for other preprocedural examination: Secondary | ICD-10-CM | POA: Diagnosis not present

## 2021-12-16 DIAGNOSIS — C61 Malignant neoplasm of prostate: Secondary | ICD-10-CM

## 2021-12-28 ENCOUNTER — Other Ambulatory Visit: Payer: Self-pay | Admitting: Urology

## 2021-12-28 DIAGNOSIS — C61 Malignant neoplasm of prostate: Secondary | ICD-10-CM

## 2022-01-10 ENCOUNTER — Other Ambulatory Visit (HOSPITAL_COMMUNITY): Payer: Self-pay | Admitting: Physician Assistant

## 2022-01-10 ENCOUNTER — Other Ambulatory Visit (HOSPITAL_BASED_OUTPATIENT_CLINIC_OR_DEPARTMENT_OTHER): Payer: Self-pay

## 2022-01-10 MED ORDER — METOPROLOL SUCCINATE ER 25 MG PO TB24
25.0000 mg | ORAL_TABLET | Freq: Every day | ORAL | 0 refills | Status: DC
  Filled 2022-01-10: qty 15, 15d supply, fill #0

## 2022-01-10 MED ORDER — FLECAINIDE ACETATE 50 MG PO TABS
50.0000 mg | ORAL_TABLET | Freq: Two times a day (BID) | ORAL | 0 refills | Status: DC
  Filled 2022-01-10: qty 60, 30d supply, fill #0

## 2022-01-11 ENCOUNTER — Other Ambulatory Visit (HOSPITAL_BASED_OUTPATIENT_CLINIC_OR_DEPARTMENT_OTHER): Payer: Self-pay

## 2022-01-13 ENCOUNTER — Other Ambulatory Visit: Payer: Self-pay

## 2022-01-13 ENCOUNTER — Ambulatory Visit
Admission: RE | Admit: 2022-01-13 | Discharge: 2022-01-13 | Disposition: A | Discharge status: Home / Self Care | Payer: 59 | Source: Ambulatory Visit | Attending: Urology | Admitting: Urology

## 2022-01-13 DIAGNOSIS — C61 Malignant neoplasm of prostate: Secondary | ICD-10-CM

## 2022-01-23 ENCOUNTER — Other Ambulatory Visit (HOSPITAL_BASED_OUTPATIENT_CLINIC_OR_DEPARTMENT_OTHER): Payer: Self-pay

## 2022-01-23 MED ORDER — DIAZEPAM 5 MG PO TABS
10.0000 mg | ORAL_TABLET | ORAL | 0 refills | Status: DC
  Filled 2022-01-23: qty 2, 1d supply, fill #0

## 2022-01-24 ENCOUNTER — Other Ambulatory Visit (HOSPITAL_BASED_OUTPATIENT_CLINIC_OR_DEPARTMENT_OTHER): Payer: Self-pay

## 2022-01-24 ENCOUNTER — Other Ambulatory Visit (HOSPITAL_COMMUNITY): Payer: Self-pay | Admitting: Physician Assistant

## 2022-01-28 ENCOUNTER — Other Ambulatory Visit (HOSPITAL_BASED_OUTPATIENT_CLINIC_OR_DEPARTMENT_OTHER): Payer: Self-pay

## 2022-01-28 MED ORDER — TAMSULOSIN HCL 0.4 MG PO CAPS
0.4000 mg | ORAL_CAPSULE | Freq: Every day | ORAL | 5 refills | Status: DC
  Filled 2022-01-28: qty 30, 30d supply, fill #0

## 2022-02-04 ENCOUNTER — Other Ambulatory Visit (HOSPITAL_BASED_OUTPATIENT_CLINIC_OR_DEPARTMENT_OTHER): Payer: Self-pay

## 2022-02-06 ENCOUNTER — Ambulatory Visit (HOSPITAL_COMMUNITY)
Admission: RE | Admit: 2022-02-06 | Discharge: 2022-02-06 | Disposition: A | Discharge status: Home / Self Care | Payer: 59 | Source: Ambulatory Visit | Attending: Physician Assistant | Admitting: Physician Assistant

## 2022-02-06 ENCOUNTER — Other Ambulatory Visit (HOSPITAL_BASED_OUTPATIENT_CLINIC_OR_DEPARTMENT_OTHER): Payer: Self-pay

## 2022-02-06 VITALS — BP 128/80 | HR 87 | Ht 73.0 in | Wt 166.6 lb

## 2022-02-06 DIAGNOSIS — R0683 Snoring: Secondary | ICD-10-CM | POA: Insufficient documentation

## 2022-02-06 DIAGNOSIS — I480 Paroxysmal atrial fibrillation: Secondary | ICD-10-CM | POA: Insufficient documentation

## 2022-02-06 DIAGNOSIS — J45909 Unspecified asthma, uncomplicated: Secondary | ICD-10-CM | POA: Insufficient documentation

## 2022-02-06 DIAGNOSIS — R400 Somnolence: Secondary | ICD-10-CM | POA: Diagnosis not present

## 2022-02-06 DIAGNOSIS — G8929 Other chronic pain: Secondary | ICD-10-CM | POA: Diagnosis not present

## 2022-02-06 DIAGNOSIS — Z79899 Other long term (current) drug therapy: Secondary | ICD-10-CM | POA: Diagnosis not present

## 2022-02-06 MED ORDER — DILTIAZEM HCL 30 MG PO TABS
ORAL_TABLET | ORAL | 2 refills | Status: DC
  Filled 2022-02-06: qty 45, 8d supply, fill #0
  Filled 2022-03-07: qty 45, 8d supply, fill #1
  Filled 2022-04-09: qty 45, 8d supply, fill #2

## 2022-02-06 MED ORDER — FLECAINIDE ACETATE 50 MG PO TABS
50.0000 mg | ORAL_TABLET | Freq: Two times a day (BID) | ORAL | 6 refills | Status: DC
  Filled 2022-02-06: qty 60, 30d supply, fill #0
  Filled 2022-04-09: qty 60, 30d supply, fill #1
  Filled 2022-05-08: qty 60, 30d supply, fill #2
  Filled 2022-06-11: qty 60, 30d supply, fill #3
  Filled 2022-07-24: qty 60, 30d supply, fill #4

## 2022-02-06 MED ORDER — METOPROLOL SUCCINATE ER 25 MG PO TB24
25.0000 mg | ORAL_TABLET | Freq: Every day | ORAL | 6 refills | Status: DC
  Filled 2022-02-06: qty 30, 30d supply, fill #0
  Filled 2022-03-07: qty 30, 30d supply, fill #1
  Filled 2022-04-09: qty 30, 30d supply, fill #2
  Filled 2022-05-08: qty 30, 30d supply, fill #3
  Filled 2022-06-11: qty 30, 30d supply, fill #4
  Filled 2022-07-24: qty 30, 30d supply, fill #5

## 2022-02-06 NOTE — Progress Notes (Signed)
? ? ?Primary Care Physician: Delilah Shan, MD ?Primary Cardiologist: none ?Primary Electrophysiologist: none ?Referring Physician: [name] ? ? ?Christian Mitchell is a 61 y.o. male with a history of asthma, Hep C, and chronic pain, alcohol use, and paroxysmal atrial fibrillation who presents for follow up in the [facility]. Patient was in his usual state of health until 02/14/19 when he noted heart racing symptoms similar to his previous afib episode. He took PRN diltiazem but became anxious and went to ER. He was in afib with RVR but spontaneously converted after about 30-45 minutes. Patient admits that he had been working hard outside and may have been dehydrated. He also admits to "drinking a few beers" prior to episode. He does admit to snoring and daytime somnolence.  ? ?On follow up today, patient reports that he has done well since his last visit without tachypalpitations. He is tolerating the medication without difficulty. He did have an episode and chest discomfort and bilateral arm tingling about 4 months ago that occurred while getting out of bed in the morning. It resolved quickly while getting dressed.  ? ?Today, he denies symptoms of palpitaitons, chest pain, shortness of breath, orthopnea, PND, lower extremity edema, dizziness, syncope, bleeding, or neurologic sequela. The patient is tolerating medications without difficulties and is otherwise without complaint today.  ? ? ?Atrial Fibrillation Risk Factors: ? ?he does have symptoms of sleep apnea. Sleep study ordered. ?he does not have a history of rheumatic fever. ?he does have a history of alcohol use. ?The patient does not have a history of early familial atrial fibrillation or other arrhythmias. ? ?he has a BMI of Body mass index is 21.98 kg/m?Marland KitchenMarland Kitchen ?Filed Weights  ? 02/06/22 1335  ?Weight: 75.6 kg  ? ? ?Family History  ?Problem Relation Age of Onset  ? Arthritis Mother   ? Diabetes Mother   ? Cancer Father   ?      prostate  ? Arthritis Maternal Grandmother   ? Heart disease Maternal Grandmother   ? Arthritis Paternal Grandmother   ? Stroke Paternal Grandmother   ? Diabetes Paternal Grandmother   ? Cancer Paternal Grandfather   ?     colon  ? Hyperlipidemia Paternal Grandfather   ? Hypertension Paternal Grandfather   ? ? ? ?Atrial Fibrillation Management history: ? ?Previous antiarrhythmic drugs: flecainide ?Previous cardioversions: none ?Previous ablations: none ?CHADS2VASC score: 0 ?Anticoagulation history: Xarelto (stopped) ? ? ?Past Medical History:  ?Diagnosis Date  ? Asthma   ? Atrial fibrillation ([place])   ? Chronic back pain   ? Chronic neck pain   ? GERD (gastroesophageal reflux disease)   ? Hepatitis C   ? IV drug abuse ([place])   ? Kidney stones   ? Opiate misuse   ? Wears glasses   ? Wears partial dentures   ? ?Past Surgical History:  ?Procedure Laterality Date  ? [place]  ? reattachement, left 1st - 4th  ? minor laceration repair    ? ? ?Current Outpatient Medications  ?Medication Sig Dispense Refill  ? albuterol (VENTOLIN HFA) 108 (90 Base) MCG/ACT inhaler Inhale 2 puffs into the lungs every 6 (six) hours as needed for wheezing or shortness of breath. 18 g 3  ? albuterol (VENTOLIN HFA) 108 (90 Base) MCG/ACT inhaler Inhale 1 puff by mouth as needed every 6 hours as needed 18 g 0  ? cyclobenzaprine (FLEXERIL) 10 MG tablet Take 1 tablet (10 mg total) by mouth  at bedtime as needed for 20 days. 20 tablet 1  ? ibuprofen (ADVIL) 800 MG tablet Take 1 tablet (800 mg total) by mouth every 8 (eight) hours as needed for 7 days. Take with food or milk. 21 tablet 0  ? ketoconazole (NIZORAL) 2 % cream Apply 1 application on to the skin once daily for 14 days 15 g 0  ? megestrol (MEGACE) 40 MG tablet Take 1 tablet (40 mg total) by mouth daily. May increase to twice a day if no improvement. 30 tablet 3  ? Multiple Vitamin (ONE-A-DAY MENS PO) Take 1 tablet by mouth daily.    ? Omega-3 1000 MG CAPS Take 1 capsule by mouth 3  (three) times daily.    ? omeprazole (PRILOSEC) 40 MG capsule Take 1 capsule by mouth daily.    ? sertraline (ZOLOFT) 50 MG tablet Take 1 tablet (50 mg total) by mouth daily. 90 tablet 3  ? tamsulosin (FLOMAX) 0.4 MG CAPS capsule Take 1 capsule by mouth once a day 30 capsule 5  ? diazepam (VALIUM) 5 MG tablet Take 2 tablets (10 mg total) by mouth one hour prior to procedure. (Patient not taking: Reported on 02/06/2022) 2 tablet 0  ? diltiazem (CARDIZEM) 30 MG tablet TAKE 1 TABLET BY MOUTH EVERY 4 HOURS AS NEEDED FOR AFIB HEART RATE >100 45 tablet 2  ? flecainide (TAMBOCOR) 50 MG tablet Take 1 tablet (50 mg total) by mouth 2 (two) times daily. Appointment Required For Further Refills [phone] 60 tablet 6  ? metoprolol succinate (TOPROL-XL) 25 MG 24 hr tablet Take 1 tablet (25 mg total) by mouth daily. Appointment Required For Further Refills [phone] 30 tablet 6  ? ?No current facility-administered medications for this encounter.  ? ? ?No Known Allergies ? ?Social History  ? ?Socioeconomic History  ? Marital status: Married  ?  Spouse name: Not on file  ? Number of children: Not on file  ? Years of education: Not on file  ? Highest education level: Not on file  ?Occupational History  ? Not on file  ?Tobacco Use  ? Smoking status: Former  ?  Packs/day: 1.00  ?  Years: 25.00  ?  Pack years: 25.00  ?  Types: Cigarettes  ? Smokeless tobacco: Never  ?Substance and Sexual Activity  ? Alcohol use: Yes  ?  Alcohol/week: 1.0 standard drink  ?  Types: 1 Cans of beer per week  ? Drug use: Not Currently  ?  Types: IV  ? Sexual activity: Not on file  ?Other Topics Concern  ? Not on file  ?Social History Narrative  ? Married for 5 years-fourth marriage  ? Lives with wife and 3 stepchildren  ? Grew up in [place], [place]  ? ?Social Determinants of Health  ? ?Financial Resource Strain: Not on file  ?Food Insecurity: Not on file  ?Transportation Needs: Not on file  ?Physical Activity: Not on file  ?Stress: Not on file   ?Social Connections: Not on file  ?Intimate Partner Violence: Not on file  ? ? ? ?ROS- All systems are reviewed and negative except as per the HPI above. ? ?Physical Exam: ?Vitals:  ? 02/06/22 1335  ?BP: 128/80  ?Pulse: 87  ?Weight: 75.6 kg  ?Height: '6\' 1"'$  (1.854 m)  ? ? ?GEN- The patient is a well appearing male, alert and oriented x 3 today.   ?HEENT-head normocephalic, atraumatic, sclera clear, conjunctiva pink, hearing intact, trachea midline. ?Lungs- Clear to ausculation bilaterally, normal work of breathing ?  Heart- Regular rate and rhythm, no murmurs, rubs or gallops  ?GI- soft, NT, ND, + BS ?Extremities- no clubbing, cyanosis, or edema ?MS- no significant deformity or atrophy ?Skin- no rash or lesion ?Psych- euthymic mood, full affect ?Neuro- strength and sensation are intact ? ? ?Wt Readings from Last 3 Encounters:  ?02/06/22 75.6 kg  ?12/27/20 81.5 kg  ?09/05/20 85.2 kg  ? ? ?EKG today demonstrates  ?SR ?Vent. rate 87 BPM ?PR interval 170 ms ?QRS duration 112 ms ?QT/QTcB 412/495 ms ? ?Echo 03/23/19 demonstrated  ? 1. The left ventricle has normal systolic function, with an ejection fraction of 55-60%. The cavity size was normal. Left ventricular diastolic parameters were normal. ? 2. The right ventricle has normal systolic function. The cavity was normal. There is no increase in right ventricular wall thickness. ? 3. No evidence of mitral valve stenosis. ? 4. The aortic valve is tricuspid. No stenosis of the aortic valve. ? 5. The ascending aorta is normal in size and structure. ? ?Epic records are reviewed at length today ? ?Assessment and Plan: ? ?1. Paroxysmal atrial fibrillation ?Patient appears to be maintaining SR.  ?Continue flecainide 50 mg BID ?Continue metoprolol 25 mg daily ?Continue diltiazem 30 mg PRN q4hrs ? ?This patients CHA2DS2-VASc Score and unadjusted Ischemic Stroke Rate (% per year) is equal to 0.2 % stroke rate/year from a score of 0 ? ?Above score calculated as 1 point each if present  [CHF, HTN, DM, Vascular=MI/PAD/Aortic Plaque, Age if 89-74, or Male] ?Above score calculated as 2 points each if present [Age > 75, or Stroke/TIA/TE] ? ? ?Follow up in the AF clinic in 6 months.  ? ? ?Ri

## 2022-02-19 ENCOUNTER — Ambulatory Visit
Admission: RE | Admit: 2022-02-19 | Discharge: 2022-02-19 | Disposition: A | Discharge status: Home / Self Care | Payer: 59 | Source: Ambulatory Visit | Attending: Urology | Admitting: Urology

## 2022-02-19 DIAGNOSIS — C61 Malignant neoplasm of prostate: Secondary | ICD-10-CM | POA: Diagnosis not present

## 2022-02-19 MED ORDER — GADOBENATE DIMEGLUMINE 529 MG/ML IV SOLN
16.0000 mL | Freq: Once | INTRAVENOUS | Status: AC | PRN
  Administered 2022-02-19: 16 mL via INTRAVENOUS

## 2022-03-07 ENCOUNTER — Other Ambulatory Visit (HOSPITAL_BASED_OUTPATIENT_CLINIC_OR_DEPARTMENT_OTHER): Payer: Self-pay

## 2022-03-07 MED ORDER — MEGESTROL ACETATE 40 MG PO TABS
ORAL_TABLET | ORAL | 1 refills | Status: DC
  Filled 2022-03-07: qty 30, 15d supply, fill #0
  Filled 2022-04-09: qty 30, 15d supply, fill #1

## 2022-03-12 ENCOUNTER — Other Ambulatory Visit (HOSPITAL_BASED_OUTPATIENT_CLINIC_OR_DEPARTMENT_OTHER): Payer: Self-pay

## 2022-03-12 MED ORDER — TAMSULOSIN HCL 0.4 MG PO CAPS
ORAL_CAPSULE | ORAL | 3 refills | Status: DC
  Filled 2022-03-12: qty 30, 30d supply, fill #0
  Filled 2022-04-09: qty 30, 30d supply, fill #1
  Filled 2022-05-08: qty 30, 30d supply, fill #2
  Filled 2022-06-11: qty 30, 30d supply, fill #3
  Filled 2022-07-24: qty 30, 30d supply, fill #4
  Filled 2022-08-26: qty 30, 30d supply, fill #5
  Filled 2022-09-26: qty 30, 30d supply, fill #6
  Filled 2022-10-18 – 2022-10-22 (×2): qty 30, 30d supply, fill #7
  Filled 2022-11-27: qty 30, 30d supply, fill #8
  Filled 2022-12-31: qty 30, 30d supply, fill #9
  Filled 2023-01-27: qty 30, 30d supply, fill #10
  Filled 2023-02-07: qty 30, 30d supply, fill #11

## 2022-03-12 MED ORDER — LEVOFLOXACIN 750 MG PO TABS
ORAL_TABLET | ORAL | 0 refills | Status: DC
  Filled 2022-03-12: qty 1, 1d supply, fill #0

## 2022-04-09 ENCOUNTER — Other Ambulatory Visit (HOSPITAL_BASED_OUTPATIENT_CLINIC_OR_DEPARTMENT_OTHER): Payer: Self-pay

## 2022-04-10 ENCOUNTER — Other Ambulatory Visit (HOSPITAL_BASED_OUTPATIENT_CLINIC_OR_DEPARTMENT_OTHER): Payer: Self-pay

## 2022-04-10 MED ORDER — ALBUTEROL SULFATE HFA 108 (90 BASE) MCG/ACT IN AERS
INHALATION_SPRAY | RESPIRATORY_TRACT | 1 refills | Status: DC
  Filled 2022-04-10: qty 6.7, 25d supply, fill #0
  Filled 2022-05-08: qty 6.7, 30d supply, fill #0
  Filled 2022-08-26: qty 6.7, 30d supply, fill #1

## 2022-04-15 ENCOUNTER — Other Ambulatory Visit (HOSPITAL_BASED_OUTPATIENT_CLINIC_OR_DEPARTMENT_OTHER): Payer: Self-pay

## 2022-04-18 ENCOUNTER — Other Ambulatory Visit (HOSPITAL_BASED_OUTPATIENT_CLINIC_OR_DEPARTMENT_OTHER): Payer: Self-pay

## 2022-05-08 ENCOUNTER — Other Ambulatory Visit (HOSPITAL_COMMUNITY): Payer: Self-pay | Admitting: Physician Assistant

## 2022-05-08 ENCOUNTER — Other Ambulatory Visit (HOSPITAL_BASED_OUTPATIENT_CLINIC_OR_DEPARTMENT_OTHER): Payer: Self-pay

## 2022-05-08 MED ORDER — DILTIAZEM HCL 30 MG PO TABS
ORAL_TABLET | ORAL | 2 refills | Status: DC
  Filled 2022-05-08: qty 45, 8d supply, fill #0
  Filled 2022-06-11: qty 45, 8d supply, fill #1
  Filled 2022-07-09 – 2022-07-24 (×2): qty 45, 8d supply, fill #2

## 2022-05-09 ENCOUNTER — Encounter: Payer: Self-pay | Admitting: Gastroenterology

## 2022-06-10 ENCOUNTER — Ambulatory Visit: Payer: 59 | Admitting: Gastroenterology

## 2022-06-10 ENCOUNTER — Encounter: Payer: Self-pay | Admitting: Gastroenterology

## 2022-06-10 VITALS — BP 115/68 | HR 78 | Ht 73.0 in | Wt 173.2 lb

## 2022-06-10 DIAGNOSIS — B1920 Unspecified viral hepatitis C without hepatic coma: Secondary | ICD-10-CM

## 2022-06-10 DIAGNOSIS — R69 Illness, unspecified: Secondary | ICD-10-CM | POA: Diagnosis not present

## 2022-06-10 NOTE — Patient Instructions (Addendum)
_______________________________________________________  If you are age 60 or older, your body mass index should be between 23-30. Your Body mass index is 22.85 kg/m. If this is out of the aforementioned range listed, please consider follow up with your Primary Care Provider.  If you are age 43 or younger, your body mass index should be between 19-25. Your Body mass index is 22.85 kg/m. If this is out of the aformentioned range listed, please consider follow up with your Primary Care Provider.   ________________________________________________________  The [facility] providers would like to encourage you to use [facility] to communicate with providers for non-urgent requests or questions.  Due to long hold times on the telephone, sending your provider a message by [facility] may be a faster and more efficient way to get a response.  Please allow 48 business hours for a response.  Please remember that this is for non-urgent requests.  _______________________________________________________  A referral is being placed for you at [facility]. Address is [address] [place] [address]. Phone number [phone]. Fax number [phone] Call them in 2 to 3 weeks if you haven't from them.  It was a pleasure to see you today!  Vito Cirigliano, D.O.

## 2022-06-10 NOTE — Progress Notes (Signed)
Chief Complaint:  Hepatitis C   Referring Provider:     Delilah Shan, MD    HPI:     Christian Mitchell is a 61 y.o. male with a history of paroxysmal atrial fibrillation, GAD, asthma, chronic pain, alcohol use, hepatitis C, melanoma, referred to the Gastroenterology Clinic for evaluation of Hepatitis C.   Thinks he was diagnosed 20+ years ago. Has not had labs for this since 2018: HCV AB+, VL 3.4 million with AST/ALT 57/70 with normal albumin, Tbili, ALP. Last set of liver enzymes from 12/22/2019 were normal.   Hx of IVDU 20+ years ago. No hx of blood transfusions. Does have several prison tattooes.   Had a colonoscopy and EGD in [place] approximately 4 years ago and normal per patient. No recent hematochezia or GI sxs.   Diagnosed with Prostate Cancer 4-5 years ago but he had deferred treatment until more recently seeking medical care.   Was last seen in atrial fibrillation clinic on 02/06/2022.  Treated with flecainide, metoprolol, diltiazem  prn; no plan to initiate systemic anticoagulation.  No recent labs or abdominal imaging for review.  Last set of liver enzymes from 12/22/2019 were normal   Past Medical History:  Diagnosis Date   Asthma    Atrial fibrillation (HCC)    Chronic back pain    Chronic neck pain    GERD (gastroesophageal reflux disease)    Hepatitis C    IV drug abuse ([place])    Kidney stones    Opiate misuse    Prostate cancer ([place])    Wears glasses    Wears partial dentures      Past Surgical History:  Procedure Laterality Date   FINGER SURGERY  10/21/1997   reattachement, left 1st - 4th   MELANOMA EXCISION LEFT FOREARM Left 07/20/2020   MELANOMA EXCISION LEFT FOREARM WITH ROTATION FLAP   minor laceration repair     Family History  Problem Relation Age of Onset   Arthritis Mother    Diabetes Mother    Cancer Father        prostate   Arthritis Maternal Grandmother    Heart disease Maternal Grandmother    Stomach cancer  Maternal Grandfather    Arthritis Paternal Grandmother    Stroke Paternal Grandmother    Diabetes Paternal Grandmother    Cancer Paternal Grandfather        colon   Hyperlipidemia Paternal Grandfather    Hypertension Paternal Grandfather    Colon cancer Paternal Grandfather    Esophageal cancer Neg Hx    Colon polyps Neg Hx    Social History   Tobacco Use   Smoking status: Former    Packs/day: 1.00    Years: 25.00    Total pack years: 25.00    Types: Cigarettes   Smokeless tobacco: Never  Vaping Use   Vaping Use: Never used  Substance Use Topics   Alcohol use: Yes    Alcohol/week: 1.0 standard drink of alcohol    Types: 1 Cans of beer per week   Drug use: Not Currently    Types: IV   Current Outpatient Medications  Medication Sig Dispense Refill   cyclobenzaprine (FLEXERIL) 10 MG tablet Take 1 tablet (10 mg total) by mouth at bedtime as needed for 20 days. 20 tablet 1   ketoconazole (NIZORAL) 2 % cream Apply 1 application on to the skin once daily for 14 days 15 g  0   megestrol (MEGACE) 40 MG tablet Take 1 tablet (40 mg total) by mouth daily. May increase to twice a day if no improvement. 30 tablet 1   metoprolol succinate (TOPROL-XL) 25 MG 24 hr tablet Take 1 tablet (25 mg total) by mouth daily. Appointment Required For Further Refills [phone] 30 tablet 6   Multiple Vitamin (ONE-A-DAY MENS PO) Take 1 tablet by mouth daily.     Omega-3 1000 MG CAPS Take 1 capsule by mouth 3 (three) times daily.     omeprazole (PRILOSEC) 40 MG capsule Take 1 capsule by mouth daily.     sertraline (ZOLOFT) 50 MG tablet Take 1 tablet (50 mg total) by mouth daily. 90 tablet 3   tamsulosin (FLOMAX) 0.4 MG CAPS capsule Take 1 capsule by mouth once a day 30 capsule 5   tamsulosin (FLOMAX) 0.4 MG CAPS capsule Take 1 capsule by mouth at bedtime 90 capsule 3   albuterol (VENTOLIN HFA) 108 (90 Base) MCG/ACT inhaler Inhale 2 puffs into the lungs every 6 (six) hours as needed for wheezing or  shortness of breath. (Patient not taking: Reported on 06/10/2022) 18 g 3   albuterol (VENTOLIN HFA) 108 (90 Base) MCG/ACT inhaler Inhale 1 puff by mouth as needed every 6 hours as needed (Patient not taking: Reported on 06/10/2022) 18 g 0   albuterol (VENTOLIN HFA) 108 (90 Base) MCG/ACT inhaler Inhale 1 puff by mouth into the lungs as needed every 6 hours as needed; 1 hour before meal 7 days 6.7 g 1   diazepam (VALIUM) 5 MG tablet Take 2 tablets (10 mg total) by mouth one hour prior to procedure. (Patient not taking: Reported on 02/06/2022) 2 tablet 0   diltiazem (CARDIZEM) 30 MG tablet TAKE 1 TABLET BY MOUTH EVERY 4 HOURS AS NEEDED FOR AFIB HEART RATE >100 45 tablet 2   flecainide (TAMBOCOR) 50 MG tablet Take 1 tablet (50 mg total) by mouth 2 (two) times daily. Appointment Required For Further Refills [phone] 60 tablet 6   ibuprofen (ADVIL) 800 MG tablet Take 1 tablet (800 mg total) by mouth every 8 (eight) hours as needed for 7 days. Take with food or milk. (Patient not taking: Reported on 06/10/2022) 21 tablet 0   levofloxacin (LEVAQUIN) 750 MG tablet Take 1 tablet by mouth once Take one hour prior to biopsy (Patient not taking: Reported on 06/10/2022) 1 tablet 0   No current facility-administered medications for this visit.   No Known Allergies   Review of Systems: All systems reviewed and negative except where noted in HPI.     Physical Exam:    Wt Readings from Last 3 Encounters:  06/10/22 173 lb 3.2 oz (78.6 kg)  02/06/22 166 lb 9.6 oz (75.6 kg)  12/27/20 179 lb 9.6 oz (81.5 kg)    BP 115/68   Pulse 78   Ht '6\' 1"'$  (1.854 m)   Wt 173 lb 3.2 oz (78.6 kg)   SpO2 92%   BMI 22.85 kg/m  Constitutional:  Pleasant, in no acute distress. Psychiatric: Normal mood and affect. Behavior is normal. Abdominal: Soft, nondistended, nontender.  Neurological: Alert and oriented to person place and time. Skin: Skin is warm and dry. No rashes noted.   ASSESSMENT AND PLAN;   1) Hepatitis  C Longstanding history of chronic hepatitis C, treatment nave.  Since I do not treat HCV, I will place referral to [facility] for evaluation and treatment.  I offered repeat labs and at least RUQ [place] to  be done prior to referral, but he declined in favor of obtaining after being seen in the Hepatology clinic - Referral to [facility]  2) Colon cancer screening -Per patient, UTD on CRC screening. Will request records from [facility] for review  RTC prn    Lavena Bullion, DO, [facility]  06/10/2022, 2:32 PM   Delilah Shan, MD

## 2022-06-11 ENCOUNTER — Other Ambulatory Visit (HOSPITAL_BASED_OUTPATIENT_CLINIC_OR_DEPARTMENT_OTHER): Payer: Self-pay

## 2022-06-17 ENCOUNTER — Other Ambulatory Visit (HOSPITAL_BASED_OUTPATIENT_CLINIC_OR_DEPARTMENT_OTHER): Payer: Self-pay

## 2022-06-18 ENCOUNTER — Other Ambulatory Visit (HOSPITAL_BASED_OUTPATIENT_CLINIC_OR_DEPARTMENT_OTHER): Payer: Self-pay

## 2022-06-18 MED ORDER — MEGESTROL ACETATE 40 MG PO TABS
ORAL_TABLET | ORAL | 0 refills | Status: DC
  Filled 2022-06-18 – 2022-07-09 (×2): qty 60, 30d supply, fill #0

## 2022-06-25 DIAGNOSIS — C61 Malignant neoplasm of prostate: Secondary | ICD-10-CM | POA: Diagnosis not present

## 2022-06-27 ENCOUNTER — Other Ambulatory Visit (HOSPITAL_BASED_OUTPATIENT_CLINIC_OR_DEPARTMENT_OTHER): Payer: Self-pay

## 2022-07-02 DIAGNOSIS — C61 Malignant neoplasm of prostate: Secondary | ICD-10-CM | POA: Diagnosis not present

## 2022-07-02 DIAGNOSIS — R350 Frequency of micturition: Secondary | ICD-10-CM | POA: Diagnosis not present

## 2022-07-02 DIAGNOSIS — N5201 Erectile dysfunction due to arterial insufficiency: Secondary | ICD-10-CM | POA: Diagnosis not present

## 2022-07-09 ENCOUNTER — Other Ambulatory Visit (HOSPITAL_BASED_OUTPATIENT_CLINIC_OR_DEPARTMENT_OTHER): Payer: Self-pay

## 2022-07-09 DIAGNOSIS — M25551 Pain in right hip: Secondary | ICD-10-CM | POA: Diagnosis not present

## 2022-07-09 DIAGNOSIS — C61 Malignant neoplasm of prostate: Secondary | ICD-10-CM | POA: Diagnosis not present

## 2022-07-09 DIAGNOSIS — Z6821 Body mass index (BMI) 21.0-21.9, adult: Secondary | ICD-10-CM | POA: Diagnosis not present

## 2022-07-09 DIAGNOSIS — R630 Anorexia: Secondary | ICD-10-CM | POA: Diagnosis not present

## 2022-07-09 DIAGNOSIS — I4891 Unspecified atrial fibrillation: Secondary | ICD-10-CM | POA: Diagnosis not present

## 2022-07-09 DIAGNOSIS — G8929 Other chronic pain: Secondary | ICD-10-CM | POA: Diagnosis not present

## 2022-07-09 DIAGNOSIS — R894 Abnormal immunological findings in specimens from other organs, systems and tissues: Secondary | ICD-10-CM | POA: Diagnosis not present

## 2022-07-09 DIAGNOSIS — R69 Illness, unspecified: Secondary | ICD-10-CM | POA: Diagnosis not present

## 2022-07-10 ENCOUNTER — Other Ambulatory Visit (HOSPITAL_BASED_OUTPATIENT_CLINIC_OR_DEPARTMENT_OTHER): Payer: Self-pay

## 2022-07-11 ENCOUNTER — Other Ambulatory Visit (HOSPITAL_BASED_OUTPATIENT_CLINIC_OR_DEPARTMENT_OTHER): Payer: Self-pay

## 2022-07-12 ENCOUNTER — Other Ambulatory Visit (HOSPITAL_BASED_OUTPATIENT_CLINIC_OR_DEPARTMENT_OTHER): Payer: Self-pay

## 2022-07-12 MED ORDER — IBUPROFEN 800 MG PO TABS
800.0000 mg | ORAL_TABLET | Freq: Three times a day (TID) | ORAL | 1 refills | Status: DC
Start: 2022-07-12 — End: 2022-11-14
  Filled 2022-07-12: qty 30, 30d supply, fill #0
  Filled 2022-07-24: qty 30, 10d supply, fill #0
  Filled 2022-10-24: qty 30, 10d supply, fill #1

## 2022-07-12 MED ORDER — CYCLOBENZAPRINE HCL 10 MG PO TABS
ORAL_TABLET | ORAL | 0 refills | Status: DC
  Filled 2022-07-12 – 2022-07-24 (×2): qty 20, 20d supply, fill #0

## 2022-07-17 ENCOUNTER — Other Ambulatory Visit (HOSPITAL_COMMUNITY): Payer: Self-pay | Admitting: Urology

## 2022-07-17 DIAGNOSIS — C61 Malignant neoplasm of prostate: Secondary | ICD-10-CM

## 2022-07-22 ENCOUNTER — Other Ambulatory Visit (HOSPITAL_BASED_OUTPATIENT_CLINIC_OR_DEPARTMENT_OTHER): Payer: Self-pay

## 2022-07-24 ENCOUNTER — Other Ambulatory Visit (HOSPITAL_BASED_OUTPATIENT_CLINIC_OR_DEPARTMENT_OTHER): Payer: Self-pay

## 2022-07-31 ENCOUNTER — Encounter (HOSPITAL_COMMUNITY)
Admission: RE | Admit: 2022-07-31 | Discharge: 2022-07-31 | Disposition: A | Discharge status: Home / Self Care | Payer: 59 | Source: Ambulatory Visit | Attending: Urology | Admitting: Urology

## 2022-07-31 DIAGNOSIS — C61 Malignant neoplasm of prostate: Secondary | ICD-10-CM | POA: Insufficient documentation

## 2022-07-31 MED ORDER — PIFLIFOLASTAT F 18 (PYLARIFY) INJECTION
9.0000 | Freq: Once | INTRAVENOUS | Status: AC
  Administered 2022-07-31: 7.6 via INTRAVENOUS

## 2022-08-07 ENCOUNTER — Ambulatory Visit (HOSPITAL_COMMUNITY)
Admission: RE | Admit: 2022-08-07 | Discharge: 2022-08-07 | Disposition: A | Discharge status: Home / Self Care | Payer: 59 | Source: Ambulatory Visit | Attending: Physician Assistant | Admitting: Physician Assistant

## 2022-08-07 ENCOUNTER — Other Ambulatory Visit (HOSPITAL_BASED_OUTPATIENT_CLINIC_OR_DEPARTMENT_OTHER): Payer: Self-pay

## 2022-08-07 ENCOUNTER — Encounter (HOSPITAL_COMMUNITY): Payer: Self-pay | Admitting: Physician Assistant

## 2022-08-07 VITALS — BP 100/58 | HR 77 | Ht 73.0 in | Wt 163.6 lb

## 2022-08-07 DIAGNOSIS — F1090 Alcohol use, unspecified, uncomplicated: Secondary | ICD-10-CM | POA: Diagnosis not present

## 2022-08-07 DIAGNOSIS — J45909 Unspecified asthma, uncomplicated: Secondary | ICD-10-CM | POA: Insufficient documentation

## 2022-08-07 DIAGNOSIS — G8929 Other chronic pain: Secondary | ICD-10-CM | POA: Insufficient documentation

## 2022-08-07 DIAGNOSIS — B1920 Unspecified viral hepatitis C without hepatic coma: Secondary | ICD-10-CM | POA: Diagnosis not present

## 2022-08-07 DIAGNOSIS — I480 Paroxysmal atrial fibrillation: Secondary | ICD-10-CM | POA: Diagnosis not present

## 2022-08-07 DIAGNOSIS — R69 Illness, unspecified: Secondary | ICD-10-CM | POA: Diagnosis not present

## 2022-08-07 MED ORDER — FLECAINIDE ACETATE 50 MG PO TABS
50.0000 mg | ORAL_TABLET | Freq: Two times a day (BID) | ORAL | 6 refills | Status: DC
  Filled 2022-08-07 – 2022-08-26 (×2): qty 60, 30d supply, fill #0
  Filled 2022-09-26: qty 60, 30d supply, fill #1
  Filled 2022-10-18 – 2022-10-22 (×2): qty 60, 30d supply, fill #2
  Filled 2022-11-27: qty 60, 30d supply, fill #3
  Filled 2022-12-31: qty 60, 30d supply, fill #4
  Filled 2023-01-27: qty 60, 30d supply, fill #5
  Filled 2023-02-21: qty 60, 30d supply, fill #6
  Filled 2023-02-21: qty 25, 13d supply, fill #6
  Filled 2023-02-21: qty 35, 17d supply, fill #6

## 2022-08-07 MED ORDER — METOPROLOL SUCCINATE ER 25 MG PO TB24
25.0000 mg | ORAL_TABLET | Freq: Every day | ORAL | 6 refills | Status: DC
  Filled 2022-08-07 – 2022-08-26 (×2): qty 30, 30d supply, fill #0
  Filled 2022-09-26: qty 30, 30d supply, fill #1
  Filled 2022-10-18 – 2022-10-22 (×2): qty 30, 30d supply, fill #2
  Filled 2022-11-27: qty 30, 30d supply, fill #3
  Filled 2022-12-31: qty 30, 30d supply, fill #4
  Filled 2023-01-27: qty 30, 30d supply, fill #5
  Filled 2023-02-21: qty 30, 30d supply, fill #6

## 2022-08-07 NOTE — Progress Notes (Signed)
Primary Care Physician: Delilah Shan, MD Primary Cardiologist: none Primary Electrophysiologist: none Referring Physician: [name]   Christian Mitchell is a 61 y.o. male with a history of asthma, Hep C, and chronic pain, alcohol use, and paroxysmal atrial fibrillation who presents for follow up in the [facility]. Patient was in his usual state of health until 02/14/19 when he noted heart racing symptoms similar to his previous afib episode. He took PRN diltiazem but became anxious and went to ER. He was in afib with RVR but spontaneously converted after about 30-45 minutes. Patient admits that he had been working hard outside and may have been dehydrated. He also admits to "drinking a few beers" prior to episode. He does admit to snoring and daytime somnolence.   On follow up today, patient reports that he has only been taking flecainide once daily and has had to take his PRN diltiazem 1-2 times per week. There were no specific triggers for his afib.   Today, he denies symptoms of chest pain, shortness of breath, orthopnea, PND, lower extremity edema, dizziness, syncope, bleeding, or neurologic sequela. The patient is tolerating medications without difficulties and is otherwise without complaint today.    Atrial Fibrillation Risk Factors:  he does have symptoms of sleep apnea. Sleep study ordered. he does not have a history of rheumatic fever. he does have a history of alcohol use. The patient does not have a history of early familial atrial fibrillation or other arrhythmias.  he has a BMI of Body mass index is 21.58 kg/m.Marland Kitchen Filed Weights   08/07/22 1341  Weight: 74.2 kg    Family History  Problem Relation Age of Onset   Arthritis Mother    Diabetes Mother    Cancer Father        prostate   Arthritis Maternal Grandmother    Heart disease Maternal Grandmother    Stomach cancer Maternal Grandfather    Arthritis Paternal Grandmother    Stroke  Paternal Grandmother    Diabetes Paternal Grandmother    Cancer Paternal Grandfather        colon   Hyperlipidemia Paternal Grandfather    Hypertension Paternal Grandfather    Colon cancer Paternal Grandfather    Esophageal cancer Neg Hx    Colon polyps Neg Hx      Atrial Fibrillation Management history:  Previous antiarrhythmic drugs: flecainide Previous cardioversions: none Previous ablations: none CHADS2VASC score: 0 Anticoagulation history: Xarelto   Past Medical History:  Diagnosis Date   Asthma    Atrial fibrillation (HCC)    Chronic back pain    Chronic neck pain    GERD (gastroesophageal reflux disease)    Hepatitis C    IV drug abuse ([place])    Kidney stones    Opiate misuse    Prostate cancer ([place])    Wears glasses    Wears partial dentures    Past Surgical History:  Procedure Laterality Date   FINGER SURGERY  10/21/1997   reattachement, left 1st - 4th   MELANOMA EXCISION LEFT FOREARM Left 07/20/2020   MELANOMA EXCISION LEFT FOREARM WITH ROTATION FLAP   minor laceration repair      Current Outpatient Medications  Medication Sig Dispense Refill   albuterol (VENTOLIN HFA) 108 (90 Base) MCG/ACT inhaler Inhale 1 puff by mouth into the lungs as needed every 6 hours as needed; 1 hour before meal 7 days 6.7 g 1   cyclobenzaprine (FLEXERIL) 10 MG tablet Take 1  tablet (10 mg total) by mouth at bedtime as needed for 20 days. 20 tablet 1   diltiazem (CARDIZEM) 30 MG tablet TAKE 1 TABLET BY MOUTH EVERY 4 HOURS AS NEEDED FOR AFIB HEART RATE >100 45 tablet 2   ibuprofen (ADVIL) 800 MG tablet Take 1 tablet (800 mg total) by mouth every 8 (eight) hours. 30 tablet 1   megestrol (MEGACE) 40 MG tablet Take 1 tablet (40 mg total) by mouth daily. May increase to twice a day if no improvement. 60 tablet 0   Multiple Vitamin (ONE-A-DAY MENS PO) Take 1 tablet by mouth daily.     Omega-3 1000 MG CAPS Take 1 capsule by mouth 3 (three) times daily.     omeprazole (PRILOSEC) 40 MG  capsule Take 1 capsule by mouth daily.     sertraline (ZOLOFT) 50 MG tablet Take 1 tablet (50 mg total) by mouth daily. 90 tablet 3   tamsulosin (FLOMAX) 0.4 MG CAPS capsule Take 1 capsule by mouth at bedtime 90 capsule 3   flecainide (TAMBOCOR) 50 MG tablet Take 1 tablet (50 mg total) by mouth 2 (two) times daily. 60 tablet 6   metoprolol succinate (TOPROL-XL) 25 MG 24 hr tablet Take 1 tablet (25 mg total) by mouth daily. 30 tablet 6   No current facility-administered medications for this encounter.    No Known Allergies  Social History   Socioeconomic History   Marital status: Married    Spouse name: Not on file   Number of children: 6   Years of education: Not on file   Highest education level: Not on file  Occupational History   Not on file  Tobacco Use   Smoking status: Former    Packs/day: 1.00    Years: 25.00    Total pack years: 25.00    Types: Cigarettes   Smokeless tobacco: Never   Tobacco comments:    Former smoker 08/07/22  Vaping Use   Vaping Use: Never used  Substance and Sexual Activity   Alcohol use: Yes    Alcohol/week: 6.0 standard drinks of alcohol    Types: 6 Cans of beer per week    Comment: 6 beers a week 08/07/22   Drug use: Not Currently    Types: IV   Sexual activity: Not on file  Other Topics Concern   Not on file  Social History Narrative   Married for 5 years-fourth marriage   Lives with wife and 3 stepchildren   Grew up in [place], [place]   Social Determinants of Health   Financial Resource Strain: Not on file  Food Insecurity: Not on file  Transportation Needs: Not on file  Physical Activity: Not on file  Stress: Not on file  Social Connections: Not on file  Intimate Partner Violence: Not on file     ROS- All systems are reviewed and negative except as per the HPI above.  Physical Exam: Vitals:   08/07/22 1341  BP: (!) 100/58  Pulse: 77  Weight: 74.2 kg  Height: '6\' 1"'$  (1.854 m)     GEN- The patient is a well  appearing male, alert and oriented x 3 today.   HEENT-head normocephalic, atraumatic, sclera clear, conjunctiva pink, hearing intact, trachea midline. Lungs- Clear to ausculation bilaterally, normal work of breathing Heart- Regular rate and rhythm, no murmurs, rubs or gallops  GI- soft, NT, ND, + BS Extremities- no clubbing, cyanosis, or edema MS- no significant deformity or atrophy Skin- no rash or lesion Psych- euthymic mood,  full affect Neuro- strength and sensation are intact   Wt Readings from Last 3 Encounters:  08/07/22 74.2 kg  06/10/22 78.6 kg  02/06/22 75.6 kg    EKG today demonstrates  SR Vent. rate 77 BPM PR interval 180 ms QRS duration 110 ms QT/QTcB 406/459 ms  Echo 03/23/19 demonstrated   1. The left ventricle has normal systolic function, with an ejection fraction of 55-60%. The cavity size was normal. Left ventricular diastolic parameters were normal.  2. The right ventricle has normal systolic function. The cavity was normal. There is no increase in right ventricular wall thickness.  3. No evidence of mitral valve stenosis.  4. The aortic valve is tricuspid. No stenosis of the aortic valve.  5. The ascending aorta is normal in size and structure.  Epic records are reviewed at length today  CHA2DS2-VASc Score = 0  The patient's score is based upon: CHF History: 0 HTN History: 0 Diabetes History: 0 Stroke History: 0 Vascular Disease History: 0 Age Score: 0 Gender Score: 0       ASSESSMENT AND PLAN: 1. Paroxysmal Atrial Fibrillation (ICD10:  I48.0) The patient's CHA2DS2-VASc score is 0, indicating a 0.2% annual risk of stroke.   Patient having more frequent palpitations but he has only been taking his flecainide once daily. Will have him increase back to flecainide 50 mg BID Continue metoprolol 25 mg daily Continue diltiazem 30 mg PRN q 4 hrs   Will refer him to establish with a primary cardiologist in 6 months. AF clinic in one year.    [facility] [address] [place], [place] [address] [phone] 08/07/2022 3:02 PM

## 2022-08-14 ENCOUNTER — Telehealth: Payer: Self-pay | Admitting: Radiation Oncology

## 2022-08-14 NOTE — Telephone Encounter (Signed)
LVM to schedule CON with Dr. Manning 

## 2022-08-15 ENCOUNTER — Telehealth: Payer: Self-pay | Admitting: Radiation Oncology

## 2022-08-15 NOTE — Telephone Encounter (Signed)
LVM to sched CON with Dr. Tammi Klippel

## 2022-08-19 NOTE — Progress Notes (Signed)
GU Location of Tumor / Histology: Prostate Ca  If Prostate Cancer, Gleason Score is (4 + 4) and PSA is (5.8 on 11/2021)  Biopsies      07/31/2022 Dr. Abner Greenspan NM PET (PSMA) Skull To Mid Thigh CLINICAL DATA:  Recent prostate biopsy. No surgery or current therapy. PSA of 5.8.  IMPRESSION: 1. Anterior prostatic tracer affinity is favored to be physiologic and within the prostatic urethra. No dominant correlate mass on prior MRI of 02/19/2022. 2. No nodal metastasis identified. 3. Isolated tracer affinity within the lateral right eighth rib, in the region of a chronic 8 mm sclerotic lesion. This could represent uptake within a benign lesion such as a bone island or low volume isolated osseous metastasis superimposed upon a benign sclerotic lesion. 4. Nonspecific pulmonary nodules, most likely benign/incidental. Consider chest CT follow-up at 6 months. 5. Sinus disease. 6. Right nephrolithiasis 7. Right iliopsoas bursal fluid.   Past/Anticipated interventions by urology, if any:     Past/Anticipated interventions by medical oncology, if any:   NA  Weight changes, if any: {:18581}  IPSS: SHIM:  Bowel/Bladder complaints, if any: {:18581}   Nausea/Vomiting, if any: {:18581}  Pain issues, if any:  {:18581}  SAFETY ISSUES: Prior radiation? {:18581} Pacemaker/ICD? {:18581} Possible current pregnancy? Male Is the patient on methotrexate?  No  Current Complaints / other details:

## 2022-08-20 ENCOUNTER — Other Ambulatory Visit: Payer: Self-pay

## 2022-08-20 ENCOUNTER — Ambulatory Visit
Admission: RE | Admit: 2022-08-20 | Discharge: 2022-08-20 | Disposition: A | Discharge status: Home / Self Care | Payer: 59 | Source: Ambulatory Visit | Attending: Radiation Oncology | Admitting: Radiation Oncology

## 2022-08-20 VITALS — BP 104/67 | HR 79 | Temp 97.5°F | Resp 20 | Ht 73.0 in | Wt 162.0 lb

## 2022-08-20 DIAGNOSIS — M899 Disorder of bone, unspecified: Secondary | ICD-10-CM | POA: Diagnosis not present

## 2022-08-20 DIAGNOSIS — R918 Other nonspecific abnormal finding of lung field: Secondary | ICD-10-CM | POA: Diagnosis not present

## 2022-08-20 DIAGNOSIS — Z191 Hormone sensitive malignancy status: Secondary | ICD-10-CM | POA: Diagnosis not present

## 2022-08-20 DIAGNOSIS — I4891 Unspecified atrial fibrillation: Secondary | ICD-10-CM | POA: Insufficient documentation

## 2022-08-20 DIAGNOSIS — Z85820 Personal history of malignant melanoma of skin: Secondary | ICD-10-CM | POA: Insufficient documentation

## 2022-08-20 DIAGNOSIS — Z800 Family history of malignant neoplasm of digestive organs: Secondary | ICD-10-CM | POA: Diagnosis not present

## 2022-08-20 DIAGNOSIS — C61 Malignant neoplasm of prostate: Secondary | ICD-10-CM

## 2022-08-20 DIAGNOSIS — Z87442 Personal history of urinary calculi: Secondary | ICD-10-CM | POA: Diagnosis not present

## 2022-08-20 DIAGNOSIS — N200 Calculus of kidney: Secondary | ICD-10-CM | POA: Insufficient documentation

## 2022-08-20 DIAGNOSIS — Z87891 Personal history of nicotine dependence: Secondary | ICD-10-CM | POA: Insufficient documentation

## 2022-08-20 DIAGNOSIS — M1612 Unilateral primary osteoarthritis, left hip: Secondary | ICD-10-CM | POA: Insufficient documentation

## 2022-08-20 DIAGNOSIS — Z79899 Other long term (current) drug therapy: Secondary | ICD-10-CM | POA: Diagnosis not present

## 2022-08-20 DIAGNOSIS — K219 Gastro-esophageal reflux disease without esophagitis: Secondary | ICD-10-CM | POA: Insufficient documentation

## 2022-08-20 HISTORY — DX: Malignant neoplasm of prostate: C61

## 2022-08-20 NOTE — Progress Notes (Signed)
Radiation Oncology         ([phone]) [phone] ________________________________  Initial Outpatient Consultation  Name: Christian Mitchell MRN: [id number]  Date: 08/20/2022  DOB: October 18, 1961  EX:NTZGY, Delaine Lame, MD  Janith Lima, MD   REFERRING PHYSICIAN: Janith Lima, MD  DIAGNOSIS: 61 y.o. gentleman with Stage T1c adenocarcinoma of the prostate with Gleason score of 4+4, and PSA of 5.84.    ICD-10-CM   1. Malignant neoplasm of prostate ([place])  C61       HISTORY OF PRESENT ILLNESS: Christian Mitchell is a 61 y.o. male with a diagnosis of prostate cancer. He was initially referred to Dr. Butch Penny in urology at [facility] in 01/2020 for an elevated PSA of 4.38. He underwent prostate biopsy on 03/10/20 showing Gleason 3+3 in <10% of two cores. He was lost to follow up after review of the biopsy results with Dr. Amalia Hailey on 03/14/20.  More recently, he was again noted to have an elevated PSA at 15.5 in 05/2021. Accordingly, he was referred for evaluation in urology by Dr. Abner Greenspan on 11/26/21,  digital rectal examination was performed at that time showing no nodules. Repeat PSA obtained that day showed persistent elevation at 5.84. He underwent prostate MRI on 02/19/22 showing no evidence of high-grade prostate carcinoma (PI-RADS 1). The patient proceeded to confirmatory transrectal ultrasound with 12 biopsies of the prostate on 06/25/22.  The prostate volume measured 12 cc.  Out of 12 core biopsies, 3 were positive.  The maximum Gleason score was 4+4, and this was seen in the left apex lateral. Additionally, Gleason 4+3 was seen in the left apex and Gleason 3+3 in the right apex lateral (with perineural invasion).  He underwent staging PSMA PET scan on 07/31/22 showing anterior prostatic tracer affinity without nodal metastasis. There was isolated tracer afinity within a lateral right 8th rib, in a region of a chronic 8 mm sclerotic lesion felt most likely benign but could be low volume, isolated osseous metastasis  and there were also nonspecific pulmonary nodules, most likely benign/incidental.  The patient reviewed the biopsy and imaging results with his urologist and he has kindly been referred today for discussion of potential radiation treatment options.   PREVIOUS RADIATION THERAPY: No  PAST MEDICAL HISTORY:  Past Medical History:  Diagnosis Date   Asthma    Atrial fibrillation (HCC)    Chronic back pain    Chronic neck pain    GERD (gastroesophageal reflux disease)    Hepatitis C    IV drug abuse ([place])    Kidney stones    Opiate misuse    Prostate cancer ([place])    Wears glasses    Wears partial dentures       PAST SURGICAL HISTORY: Past Surgical History:  Procedure Laterality Date   FINGER SURGERY  10/21/1997   reattachement, left 1st - 4th   MELANOMA EXCISION LEFT FOREARM Left 07/20/2020   MELANOMA EXCISION LEFT FOREARM WITH ROTATION FLAP   minor laceration repair      FAMILY HISTORY:  Family History  Problem Relation Age of Onset   Arthritis Mother    Diabetes Mother    Cancer Father        prostate   Arthritis Maternal Grandmother    Heart disease Maternal Grandmother    Stomach cancer Maternal Grandfather    Arthritis Paternal Grandmother    Stroke Paternal Grandmother    Diabetes Paternal Grandmother    Cancer Paternal Grandfather        colon  Hyperlipidemia Paternal Grandfather    Hypertension Paternal Grandfather    Colon cancer Paternal Grandfather    Esophageal cancer Neg Hx    Colon polyps Neg Hx     SOCIAL HISTORY:  Social History   Socioeconomic History   Marital status: Married    Spouse name: Not on file   Number of children: 6   Years of education: Not on file   Highest education level: Not on file  Occupational History   Not on file  Tobacco Use   Smoking status: Former    Packs/day: 1.00    Years: 25.00    Total pack years: 25.00    Types: Cigarettes   Smokeless tobacco: Never   Tobacco comments:    Former smoker 08/07/22   Vaping Use   Vaping Use: Never used  Substance and Sexual Activity   Alcohol use: Yes    Alcohol/week: 6.0 standard drinks of alcohol    Types: 6 Cans of beer per week    Comment: 6 beers a week 08/07/22   Drug use: Not Currently    Types: IV   Sexual activity: Not on file  Other Topics Concern   Not on file  Social History Narrative   Married for 5 years-fourth marriage   Lives with wife and 3 stepchildren   Grew up in [place], [place]   Social Determinants of Health   Financial Resource Strain: Not on file  Food Insecurity: Not on file  Transportation Needs: Not on file  Physical Activity: Not on file  Stress: Not on file  Social Connections: Not on file  Intimate Partner Violence: Not on file    ALLERGIES: Patient has no known allergies.  MEDICATIONS:  Current Outpatient Medications  Medication Sig Dispense Refill   albuterol (VENTOLIN HFA) 108 (90 Base) MCG/ACT inhaler Inhale 1 puff by mouth into the lungs as needed every 6 hours as needed; 1 hour before meal 7 days 6.7 g 1   cyclobenzaprine (FLEXERIL) 10 MG tablet Take 1 tablet (10 mg total) by mouth at bedtime as needed for 20 days. 20 tablet 1   diltiazem (CARDIZEM) 30 MG tablet TAKE 1 TABLET BY MOUTH EVERY 4 HOURS AS NEEDED FOR AFIB HEART RATE >100 45 tablet 2   flecainide (TAMBOCOR) 50 MG tablet Take 1 tablet (50 mg total) by mouth 2 (two) times daily. 60 tablet 6   ibuprofen (ADVIL) 800 MG tablet Take 1 tablet (800 mg total) by mouth every 8 (eight) hours. 30 tablet 1   megestrol (MEGACE) 40 MG tablet Take 1 tablet (40 mg total) by mouth daily. May increase to twice a day if no improvement. 60 tablet 0   metoprolol succinate (TOPROL-XL) 25 MG 24 hr tablet Take 1 tablet (25 mg total) by mouth daily. 30 tablet 6   Multiple Vitamin (ONE-A-DAY MENS PO) Take 1 tablet by mouth daily.     Omega-3 1000 MG CAPS Take 1 capsule by mouth 3 (three) times daily.     omeprazole (PRILOSEC) 40 MG capsule Take 1 capsule by mouth  daily.     sertraline (ZOLOFT) 50 MG tablet Take 1 tablet (50 mg total) by mouth daily. 90 tablet 3   tamsulosin (FLOMAX) 0.4 MG CAPS capsule Take 1 capsule by mouth at bedtime 90 capsule 3   No current facility-administered medications for this encounter.    REVIEW OF SYSTEMS:  On review of systems, the patient reports that he is doing well overall. He denies any chest pain, shortness  of breath, cough, fevers, chills, night sweats, unintended weight changes. He denies any bowel disturbances, and denies abdominal pain, nausea or vomiting. He denies any new musculoskeletal or joint aches or pains. His IPSS was 19, indicating moderate-severe urinary symptoms. His SHIM was 17, indicating he has moderate erectile dysfunction. A complete review of systems is obtained and is otherwise negative.    PHYSICAL EXAM:  Wt Readings from Last 3 Encounters:  08/07/22 163 lb 9.6 oz (74.2 kg)  06/10/22 173 lb 3.2 oz (78.6 kg)  02/06/22 166 lb 9.6 oz (75.6 kg)   Temp Readings from Last 3 Encounters:  01/31/20 98.5 F (36.9 C) (Oral)  04/09/19 97.7 F (36.5 C) (Oral)  02/14/19 98.3 F (36.8 C) (Axillary)   BP Readings from Last 3 Encounters:  08/07/22 (!) 100/58  06/10/22 115/68  02/06/22 128/80   Pulse Readings from Last 3 Encounters:  08/07/22 77  06/10/22 78  02/06/22 87    /10  In general this is a well appearing Caucasian male in no acute distress. He's alert and oriented x4 and appropriate throughout the examination. Cardiopulmonary assessment is negative for acute distress, and he exhibits normal effort.     KPS = 100  100 - Normal; no complaints; no evidence of disease. 90   - Able to carry on normal activity; minor signs or symptoms of disease. 80   - Normal activity with effort; some signs or symptoms of disease. 70   - Cares for self; unable to carry on normal activity or to do active work. 60   - Requires occasional assistance, but is able to care for most of his personal  needs. 50   - Requires considerable assistance and frequent medical care. 74   - Disabled; requires special care and assistance. 65   - Severely disabled; hospital admission is indicated although death not imminent. 85   - Very sick; hospital admission necessary; active supportive treatment necessary. 10   - Moribund; fatal processes progressing rapidly. 0     - Dead  Karnofsky DA, Abelmann Dixon, Craver LS and Burchenal [facility] [phone]) The use of the nitrogen mustards in the palliative treatment of carcinoma: with particular reference to bronchogenic carcinoma Cancer 1 634-56  LABORATORY DATA:  Lab Results  Component Value Date   WBC 9.5 02/14/2019   HGB 14.1 02/14/2019   HCT 42.6 02/14/2019   MCV 89.7 02/14/2019   PLT 151 02/14/2019   Lab Results  Component Value Date   NA 140 02/14/2019   K 3.3 (L) 02/14/2019   CL 104 02/14/2019   CO2 29 02/14/2019   Lab Results  Component Value Date   ALT 58 (H) 02/14/2019   AST 48 (H) 02/14/2019   ALKPHOS 72 02/14/2019   BILITOT 1.3 (H) 02/14/2019     RADIOGRAPHY: NM PET (PSMA) SKULL TO MID THIGH  Result Date: 08/05/2022 CLINICAL DATA:  Recent prostate biopsy. No surgery or current therapy. PSA of 5.8. EXAM: NUCLEAR MEDICINE PET SKULL BASE TO THIGH TECHNIQUE: 7.6 mCi F18 Piflufolastat (Pylarify) was injected intravenously. Full-ring PET imaging was performed from the skull base to thigh after the radiotracer. CT data was obtained and used for attenuation correction and anatomic localization. COMPARISON:  abdomen and pelvic CTs of 08/03/2009. CT urogram from [facility] 03/03/2012. FINDINGS: NECK No radiotracer activity in neck lymph nodes. Incidental CT finding: No cervical adenopathy. Mucosal thickening of left-greater-than-right maxillary sinuses. CHEST No radiotracer accumulation within mediastinal or hilar lymph nodes. No suspicious pulmonary nodules on the  CT scan. Incidental CT finding: Right base subsegmental atelectasis. 2 mm left upper  lobe pulmonary nodule on 92/4. 3 mm left lower lobe pulmonary nodule on 114/4. Similar to 08/03/2009 and therefore benign. A right middle lobe 5 mm nodule on 121/4 is not readily apparent on the prior abdominal CT ABDOMEN/PELVIS Prostate: Anterior prostatic tracer affinity at the midgland level measures a S.U.V. max of 8.5 and most likely is due to physiologic urethral uptake. No dominant correlate mass in this region on 02/19/2022 MRI. Lymph nodes: No abnormal radiotracer accumulation within pelvic or abdominal nodes. Liver: No evidence of liver metastasis. Incidental CT finding: Normal adrenal glands. Punctate lower pole right renal collecting system calculus. SKELETON Isolated lateral right eighth rib uptake including on 131/4 corresponds to a subtle 8 mm sclerotic lesion which is similar on 03/03/2012. low tracer affinity corresponds to a low-density collection anterior to the right hip including a 2.9 cm on 212/4 which is favored to represent fluid in the iliopsoas both bursa. Underlying right greater than left hip osteoarthritis. Remote left clavicular fracture. IMPRESSION: 1. Anterior prostatic tracer affinity is favored to be physiologic and within the prostatic urethra. No dominant correlate mass on prior MRI of 02/19/2022. 2. No nodal metastasis identified. 3. Isolated tracer affinity within the lateral right eighth rib, in the region of a chronic 8 mm sclerotic lesion. This could represent uptake within a benign lesion such as a bone island or low volume isolated osseous metastasis superimposed upon a benign sclerotic lesion. 4. Nonspecific pulmonary nodules, most likely benign/incidental. Consider chest CT follow-up at 6 months. 5. Sinus disease. 6. Right nephrolithiasis 7. Right iliopsoas bursal fluid. Electronically Signed   By: Abigail Miyamoto M.D.   On: 08/05/2022 10:22      IMPRESSION/PLAN: 1. 61 y.o. gentleman with Stage T1c adenocarcinoma of the prostate with Gleason Score of 4+4, and PSA of  5.84. We discussed the patient's workup and outlined the nature of prostate cancer in this setting. The patient's T stage, Gleason's score, and PSA put him into the high risk group. Accordingly, he is eligible for a variety of potential treatment options including LT-ADT in combination with 8 weeks of external radiation, 5 weeks of external radiation with an upfront brachytherapy boost, or prostatectomy. We discussed the available radiation techniques, and focused on the details and logistics of delivery. The patient is not a candidate for brachytherapy boost with a prostate volume of 12 cc and he is not interested in prostatectomy because he cannot take 4-6 week off work for recovery. Therefore, we discussed and outlined the risks, benefits, short and long-term effects associated with external beam radiotherapy and compared and contrasted these with prostatectomy. We discussed the role of SpaceOAR gel in reducing the rectal toxicity associated with radiotherapy. We also detailed the role of ADT in the treatment of high risk prostate cancer and outlined the associated side effects that could be expected with this therapy. He reports that his most recent Testosterone level was 4, so he may not need ADT if this is confirmed to be accurate. He was encouraged to ask questions that were answered to his stated satisfaction.  At the conclusion of our conversation, the patient is interested in moving forward with 8 weeks of external beam therapy in combination with ADT unless his T level is naturally very low. He was advised that we will share our discussion with Dr. Abner Greenspan and either make arrangements for start of ADT and/or proceed with fiducial markers and SpaceOAR gel placement, if  his T-level is very low. The patient appears to have a good understanding of his disease and our treatment recommendations which are of curative intent and is in agreement with the stated plan.  Therefore, we will move forward with treatment  planning accordingly, in anticipation of beginning IMRT in the near future if ADT is not necessary or approximately 2 months after starting ADT, pending his T-level. .  We personally spent 70 minutes in this encounter including chart review, reviewing radiological studies, meeting face-to-face with the patient, entering orders and completing documentation.    Nicholos Johns, PA-C    Tyler Pita, MD  [facility] Direct Dial: [phone]  Fax: [phone] [email or web address]  Skype  LinkedIn   This document serves as a record of services personally performed by Tyler Pita, MD and Freeman Caldron, PA-C. It was created on their behalf by Wilburn Mylar, a trained medical scribe. The creation of this record is based on the scribe's personal observations and the provider's statements to them. This document has been checked and approved by the attending provider.

## 2022-08-21 DIAGNOSIS — Z191 Hormone sensitive malignancy status: Secondary | ICD-10-CM | POA: Diagnosis not present

## 2022-08-21 DIAGNOSIS — C61 Malignant neoplasm of prostate: Secondary | ICD-10-CM | POA: Diagnosis not present

## 2022-08-26 ENCOUNTER — Other Ambulatory Visit (HOSPITAL_BASED_OUTPATIENT_CLINIC_OR_DEPARTMENT_OTHER): Payer: Self-pay

## 2022-08-26 ENCOUNTER — Other Ambulatory Visit (HOSPITAL_COMMUNITY): Payer: Self-pay | Admitting: Physician Assistant

## 2022-08-26 MED ORDER — MEGESTROL ACETATE 40 MG PO TABS
40.0000 mg | ORAL_TABLET | Freq: Two times a day (BID) | ORAL | 0 refills | Status: DC
  Filled 2022-08-26: qty 60, 30d supply, fill #0
  Filled 2022-09-26: qty 60, 30d supply, fill #1
  Filled 2022-10-18 – 2022-10-22 (×2): qty 60, 30d supply, fill #2

## 2022-08-26 MED ORDER — DILTIAZEM HCL 30 MG PO TABS
30.0000 mg | ORAL_TABLET | ORAL | 2 refills | Status: DC | PRN
  Filled 2022-08-26: qty 45, 8d supply, fill #0
  Filled 2022-09-26: qty 45, 8d supply, fill #1
  Filled 2023-04-06: qty 45, 8d supply, fill #2

## 2022-08-27 ENCOUNTER — Other Ambulatory Visit (HOSPITAL_BASED_OUTPATIENT_CLINIC_OR_DEPARTMENT_OTHER): Payer: Self-pay

## 2022-08-27 MED ORDER — SERTRALINE HCL 50 MG PO TABS
50.0000 mg | ORAL_TABLET | Freq: Every day | ORAL | 0 refills | Status: DC
  Filled 2022-08-27: qty 30, 30d supply, fill #0
  Filled 2022-09-26: qty 30, 30d supply, fill #1
  Filled 2022-10-18 – 2022-10-22 (×2): qty 30, 30d supply, fill #2

## 2022-08-28 ENCOUNTER — Other Ambulatory Visit (HOSPITAL_BASED_OUTPATIENT_CLINIC_OR_DEPARTMENT_OTHER): Payer: Self-pay

## 2022-09-10 DIAGNOSIS — Z973 Presence of spectacles and contact lenses: Secondary | ICD-10-CM | POA: Insufficient documentation

## 2022-09-10 DIAGNOSIS — Z972 Presence of dental prosthetic device (complete) (partial): Secondary | ICD-10-CM | POA: Insufficient documentation

## 2022-09-10 DIAGNOSIS — G8929 Other chronic pain: Secondary | ICD-10-CM | POA: Insufficient documentation

## 2022-09-10 DIAGNOSIS — F1190 Opioid use, unspecified, uncomplicated: Secondary | ICD-10-CM | POA: Insufficient documentation

## 2022-09-10 DIAGNOSIS — B1920 Unspecified viral hepatitis C without hepatic coma: Secondary | ICD-10-CM | POA: Insufficient documentation

## 2022-09-10 DIAGNOSIS — F1910 Other psychoactive substance abuse, uncomplicated: Secondary | ICD-10-CM | POA: Insufficient documentation

## 2022-09-10 DIAGNOSIS — J45909 Unspecified asthma, uncomplicated: Secondary | ICD-10-CM | POA: Insufficient documentation

## 2022-09-10 DIAGNOSIS — K219 Gastro-esophageal reflux disease without esophagitis: Secondary | ICD-10-CM | POA: Insufficient documentation

## 2022-09-10 DIAGNOSIS — N200 Calculus of kidney: Secondary | ICD-10-CM | POA: Insufficient documentation

## 2022-09-18 DIAGNOSIS — C61 Malignant neoplasm of prostate: Secondary | ICD-10-CM | POA: Insufficient documentation

## 2022-09-20 ENCOUNTER — Ambulatory Visit: Payer: 59 | Attending: Cardiology | Admitting: Cardiology

## 2022-09-25 ENCOUNTER — Other Ambulatory Visit: Payer: Self-pay | Admitting: Urology

## 2022-09-25 ENCOUNTER — Telehealth: Payer: Self-pay | Admitting: *Deleted

## 2022-09-25 NOTE — Telephone Encounter (Signed)
CALLED PATIENT TO INFORM OF FID. MARKERS AND SPACE OAR TO BE PLACED ON 11-14-22 AND HIS SIM ON 11-21-22- ARRIVAL TIME- 9:45 AM @ [facility], INFORMED PATIENT TO ARRIVE WITH A FULL BLADDER, SPOKE WITH PATIENT AND HE IS AWARE OF THESE APPTS. AND THE INSTRUCTIONS

## 2022-09-26 ENCOUNTER — Other Ambulatory Visit (HOSPITAL_BASED_OUTPATIENT_CLINIC_OR_DEPARTMENT_OTHER): Payer: Self-pay

## 2022-09-26 MED ORDER — ALBUTEROL SULFATE HFA 108 (90 BASE) MCG/ACT IN AERS
INHALATION_SPRAY | RESPIRATORY_TRACT | 1 refills | Status: DC
  Filled 2022-09-26: qty 6.7, 25d supply, fill #0
  Filled 2023-03-05: qty 6.7, 25d supply, fill #1

## 2022-09-29 IMAGING — MR MR PROSTATE WO/W CM
12 series · 48 of 48 positions shown · IV contrast (multihance)
Comparison: None Available.

CLINICAL DATA: Prostate carcinoma, Gleason score 6. Active
surveillance.

EXAM:
MR PROSTATE WITHOUT AND WITH CONTRAST
TECHNIQUE: Multiplanar multisequence MRI images were obtained of the pelvis
centered about the prostate. Pre and post contrast images were
obtained.
CONTRAST:  16mL MULTIHANCE GADOBENATE DIMEGLUMINE 529 MG/ML IV SOLN

[Series 3: T2 · coronal · 3.0mm · 0.56mm/px · 1 of 23 slices shown (1 of 3)]
[im 1/23]
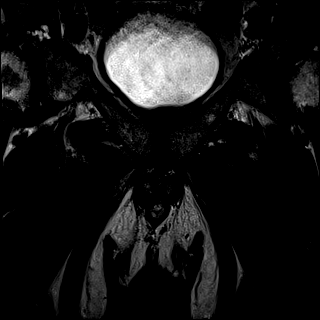

[Series 4: T1 · axial · 5.0mm · 1.25mm/px · z∈[-43,+152]mm · 2 of 80 slices shown]
[im 1/80]
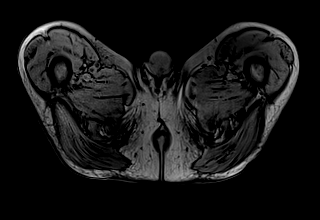
[im 80/80]
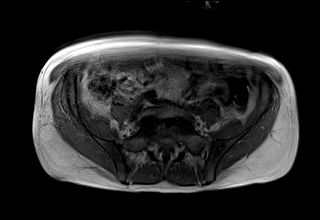

[Series 5: DWI · axial · 3.0mm · 1.75mm/px · 1 of 60 slices shown (1 of 3)]
[im 1/60]
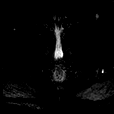

[Series 6: DWI · axial · 3.0mm · 1.75mm/px · 1 of 20 slices shown (2 of 3)]
[im 1/20]
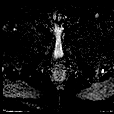

[Series 7: DWI · axial · 3.0mm · 1.75mm/px · 1 of 20 slices shown (3 of 3)]
[im 1/20]
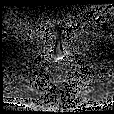

[Series 8: T2 · axial · 3.0mm · 0.56mm/px · 1 of 25 slices shown (2 of 3)]
[im 1/25]
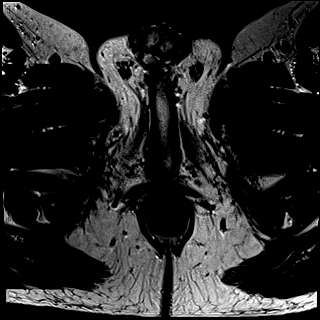

[Series 9: T2 · axial · 1.0mm · 1.04mm/px · z∈[-41,+38]mm · 3 of 80 slices shown (3 of 3)]
[im 1/80]
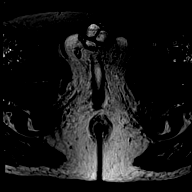
[im 40/80]
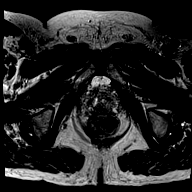
[im 80/80]
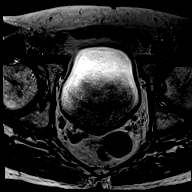

[Series 10: pre t1_twist_tra_dyn · axial · non-contrast · 3.5mm · 0.83mm/px · 1 of 24 slices shown]
[im 1/24]
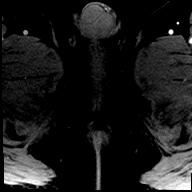

[Series 11: post t1_twist_tra_dyn-copy center · axial · non-contrast · 3.5mm · 0.83mm/px · z∈[-41,+39]mm · 16 of 456 slices shown]
[im 1/456]
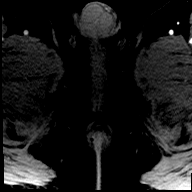
[im 31/456]
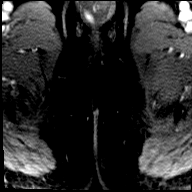
[im 61/456]
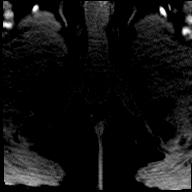
[im 92/456]
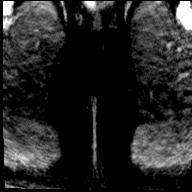
[im 122/456]
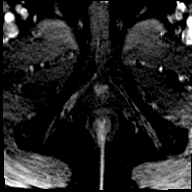
[im 152/456]
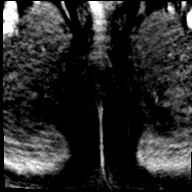
[im 183/456]
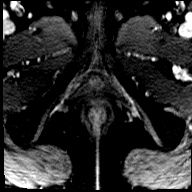
[im 213/456]
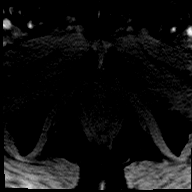
[im 243/456]
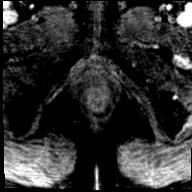
[im 274/456]
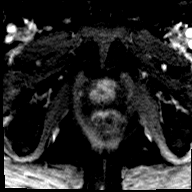
[im 304/456]
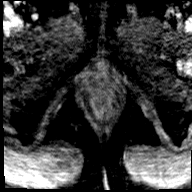
[im 334/456]
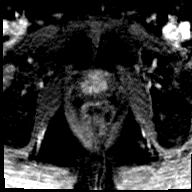
[im 365/456]
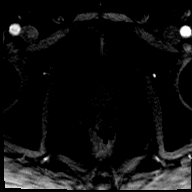
[im 395/456]
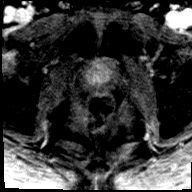
[im 425/456]
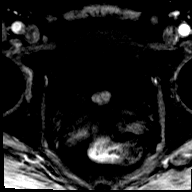
[im 456/456]
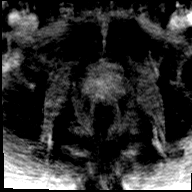

[Series 12: post t1_twist_tra_dyn-copy cent_sub · axial · 3.5mm · 0.83mm/px · z∈[-41,+39]mm · 15 of 429 slices shown]
[im 1/429]
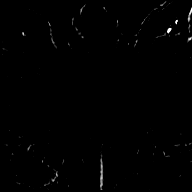
[im 31/429]
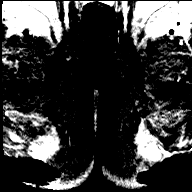
[im 62/429]
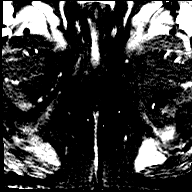
[im 92/429]
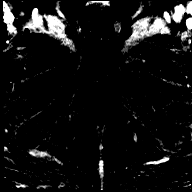
[im 123/429]
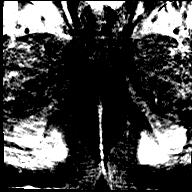
[im 153/429]
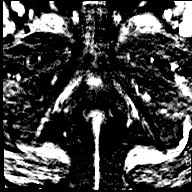
[im 184/429]
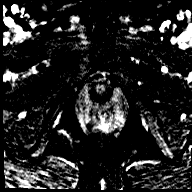
[im 215/429]
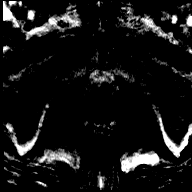
[im 245/429]
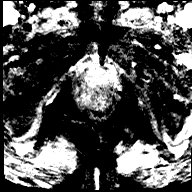
[im 276/429]
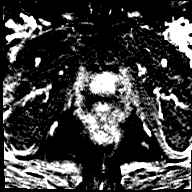
[im 306/429]
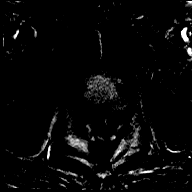
[im 337/429]
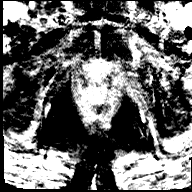
[im 367/429]
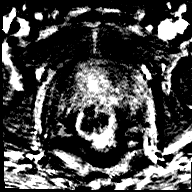
[im 398/429]
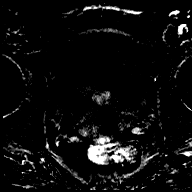
[im 429/429]
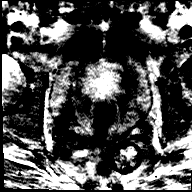

[Series 14: t1_vibe_dixon_tra_f · axial · 2.5mm · 0.91mm/px · z∈[-81,+117]mm · 3 of 80 slices shown]
[im 1/80]
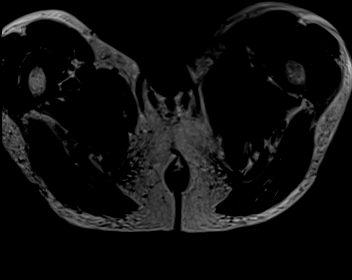
[im 40/80]
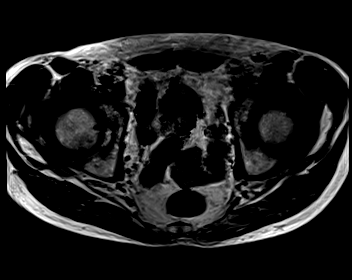
[im 80/80]
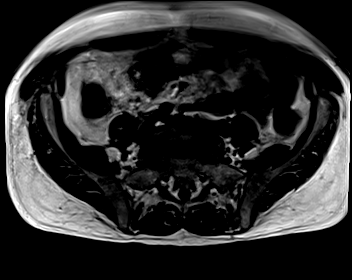

[Series 15: t1_vibe_dixon_tra_w · axial · 2.5mm · 0.91mm/px · z∈[-81,+117]mm · 3 of 80 slices shown]
[im 1/80]
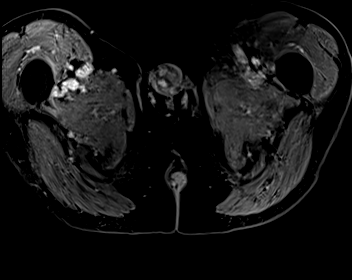
[im 40/80]
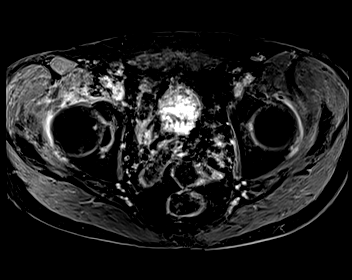
[im 80/80]
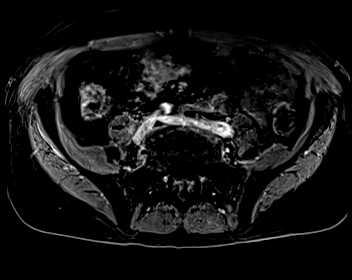

[48 of 48 positions shown; findings below may reference images not displayed]

FINDINGS: Prostate:

-- Peripheral Zone: No focal lesion seen on ADC or high b-value DWI
sequences.

-- Transition/Central Zone: Median lobe hypertrophy which indents
the bladder base. No nodules with suspicious characteristics on
T2-weighted imaging.

-- Measurements/Volume:  3.9 by 2.8 x 3.9 cm (volume = 22 cm^3)

Transcapsular spread:  Absent

Seminal vesicle involvement:  Absent

Neurovascular bundle involvement:  Absent

Pelvic adenopathy: None visualized

Bone metastasis: None visualized

Other:  None
IMPRESSION: No radiographic evidence of high-grade prostate carcinoma. PI-RADS 1
(v2.1): Very Low (clinically significant cancer highly unlikely)

## 2022-10-18 ENCOUNTER — Other Ambulatory Visit (HOSPITAL_BASED_OUTPATIENT_CLINIC_OR_DEPARTMENT_OTHER): Payer: Self-pay

## 2022-10-22 ENCOUNTER — Other Ambulatory Visit: Payer: Self-pay

## 2022-10-22 ENCOUNTER — Other Ambulatory Visit (HOSPITAL_BASED_OUTPATIENT_CLINIC_OR_DEPARTMENT_OTHER): Payer: Self-pay

## 2022-10-23 ENCOUNTER — Emergency Department (HOSPITAL_BASED_OUTPATIENT_CLINIC_OR_DEPARTMENT_OTHER): Payer: 59

## 2022-10-23 ENCOUNTER — Other Ambulatory Visit: Payer: Self-pay

## 2022-10-23 ENCOUNTER — Encounter (HOSPITAL_BASED_OUTPATIENT_CLINIC_OR_DEPARTMENT_OTHER): Payer: Self-pay | Admitting: Emergency Medicine

## 2022-10-23 DIAGNOSIS — W0110XA Fall on same level from slipping, tripping and stumbling with subsequent striking against unspecified object, initial encounter: Secondary | ICD-10-CM | POA: Insufficient documentation

## 2022-10-23 DIAGNOSIS — Z79899 Other long term (current) drug therapy: Secondary | ICD-10-CM | POA: Diagnosis not present

## 2022-10-23 DIAGNOSIS — S62014A Nondisplaced fracture of distal pole of navicular [scaphoid] bone of right wrist, initial encounter for closed fracture: Secondary | ICD-10-CM | POA: Diagnosis not present

## 2022-10-23 DIAGNOSIS — S62001A Unspecified fracture of navicular [scaphoid] bone of right wrist, initial encounter for closed fracture: Secondary | ICD-10-CM | POA: Diagnosis not present

## 2022-10-23 DIAGNOSIS — J45909 Unspecified asthma, uncomplicated: Secondary | ICD-10-CM | POA: Insufficient documentation

## 2022-10-23 DIAGNOSIS — M25531 Pain in right wrist: Secondary | ICD-10-CM | POA: Diagnosis present

## 2022-10-23 DIAGNOSIS — Z8546 Personal history of malignant neoplasm of prostate: Secondary | ICD-10-CM | POA: Insufficient documentation

## 2022-10-23 MED ORDER — IBUPROFEN 800 MG PO TABS
800.0000 mg | ORAL_TABLET | Freq: Once | ORAL | Status: AC
  Administered 2022-10-23: 800 mg via ORAL
  Filled 2022-10-23: qty 1

## 2022-10-23 NOTE — ED Triage Notes (Signed)
Pt c/o RT wrist pain s/p fall this afternoon on concrete; also sts he hit head, denies LOC

## 2022-10-24 ENCOUNTER — Emergency Department (HOSPITAL_BASED_OUTPATIENT_CLINIC_OR_DEPARTMENT_OTHER)
Admission: EM | Admit: 2022-10-24 | Discharge: 2022-10-24 | Disposition: A | Discharge status: Home / Self Care | Payer: 59 | Attending: Emergency Medicine | Admitting: Emergency Medicine

## 2022-10-24 ENCOUNTER — Other Ambulatory Visit (HOSPITAL_BASED_OUTPATIENT_CLINIC_OR_DEPARTMENT_OTHER): Payer: Self-pay

## 2022-10-24 DIAGNOSIS — S62001A Unspecified fracture of navicular [scaphoid] bone of right wrist, initial encounter for closed fracture: Secondary | ICD-10-CM

## 2022-10-24 DIAGNOSIS — W010XXA Fall on same level from slipping, tripping and stumbling without subsequent striking against object, initial encounter: Secondary | ICD-10-CM

## 2022-10-24 MED ORDER — OXYCODONE HCL 5 MG PO TABS: 5.0000 mg | ORAL_TABLET | ORAL | 0 refills | Status: DC | PRN

## 2022-10-24 MED ORDER — OXYCODONE HCL 5 MG PO TABS
5.0000 mg | ORAL_TABLET | ORAL | 0 refills | Status: DC | PRN
  Filled 2022-10-24: qty 10, 2d supply, fill #0

## 2022-10-24 NOTE — ED Provider Notes (Signed)
[facility] EMERGENCY DEPARTMENT Provider Note   CSN: [id number] Arrival date & time: 10/23/22  2240     History  Chief Complaint  Patient presents with   Christian Mitchell is a 62 y.o. male.  The history is provided by the patient.  Fall  He has history of prostate cancer, asthma, paroxysmal atrial fibrillation not on anticoagulation and comes in after injuring his right and a fall.  He states that he tripped over a jack handle.  He specifically denies head injury to me although I do note triage note stating he did hit his head.  He denies other injury.   Home Medications Prior to Admission medications   Medication Sig Start Date End Date Taking? Authorizing Provider  albuterol (VENTOLIN HFA) 108 (90 Base) MCG/ACT inhaler Inhale 1 puff by mouth into the lungs as needed every 6 hours as needed; 1 hour before meal 7 days 09/26/22     cyclobenzaprine (FLEXERIL) 10 MG tablet Take 1 tablet (10 mg total) by mouth at bedtime as needed for 20 days. 11/29/21     diltiazem (CARDIZEM) 30 MG tablet Take 1 tablet (30 mg total) by mouth every 4 (four) hours as needed for AFIB heart rate >100 08/26/22 08/26/23  Fenton, Clint R, PA  flecainide (TAMBOCOR) 50 MG tablet Take 1 tablet (50 mg total) by mouth 2 (two) times daily. 08/07/22   Fenton, Clint R, PA  ibuprofen (ADVIL) 800 MG tablet Take 1 tablet (800 mg total) by mouth every 8 (eight) hours. 07/12/22     megestrol (MEGACE) 40 MG tablet Take 1 tablet (40 mg total) by mouth 2 (two) times daily. 08/26/22     metoprolol succinate (TOPROL-XL) 25 MG 24 hr tablet Take 1 tablet (25 mg total) by mouth daily. 08/07/22   Fenton, Clint R, PA  Multiple Vitamin (ONE-A-DAY MENS PO) Take 1 tablet by mouth daily.    [provider]  Omega-3 1000 MG CAPS Take 1 capsule by mouth 3 (three) times daily.    [provider]  omeprazole (PRILOSEC) 40 MG capsule Take 1 capsule by mouth daily. 05/15/15   [provider]  sertraline  (ZOLOFT) 50 MG tablet Take 1 tablet (50 mg total) by mouth daily. 08/27/22     tamsulosin (FLOMAX) 0.4 MG CAPS capsule Take 1 capsule by mouth at bedtime 03/12/22         Allergies    Patient has no known allergies.    Review of Systems   Review of Systems  All other systems reviewed and are negative.   Physical Exam Updated Vital Signs BP (!) 135/96 (BP Location: Left Arm)   Pulse 85   Temp 98 F (36.7 C)   Resp 20   Ht '6\' 1"'$  (1.854 m)   Wt 82.1 kg   SpO2 98%   BMI 23.88 kg/m  Physical Exam Vitals and nursing note reviewed.   62 year old male, resting comfortably and in no acute distress. Vital signs are significant for mildly elevated blood pressure. Oxygen saturation is 98%, which is normal. Head is normocephalic and atraumatic. PERRLA, EOMI. Oropharynx is clear. Neck is nontender and supple without adenopathy or JVD. Back is nontender and there is no CVA tenderness. Lungs are clear without rales, wheezes, or rhonchi. Chest is nontender. Heart has regular rate and rhythm without murmur. Abdomen is soft, flat, nontender. Extremities: There is no swelling or deformity noted of the right arm or wrist, but there is point  tenderness over the anatomic snuffbox and there is marked pain with axial loading of the thumb suggesting scaphoid fracture.  Remainder of extremity exam is unremarkable. Skin is warm and dry without rash. Neurologic: Mental status is normal, cranial nerves are intact, moves all extremities equally.  ED Results / Procedures / Treatments    Radiology DG Wrist Complete Right  Result Date: 10/23/2022 CLINICAL DATA:  Fall EXAM: RIGHT WRIST - COMPLETE 3+ VIEW COMPARISON:  None Available. FINDINGS: There is a nondisplaced fracture of the right scaphoid. No other acute osseous abnormality. IMPRESSION: Nondisplaced fracture of the right scaphoid. Electronically Signed   By: Ulyses Jarred M.D.   On: 10/23/2022 23:22    Procedures .Splint Application  Date/Time:  10/24/2022 1:58 AM  Performed by: Delora Fuel, MD Authorized by: Delora Fuel, MD   Consent:    Consent obtained:  Verbal   Consent given by:  Patient   Risks, benefits, and alternatives were discussed: yes     Risks discussed:  Numbness, pain and swelling   Alternatives discussed:  No treatment Universal protocol:    Procedure explained and questions answered to patient or proxy's satisfaction: yes     Relevant documents present and verified: yes     Test results available: yes     Imaging studies available: yes     Required blood products, implants, devices, and special equipment available: yes     Site/side marked: yes     Immediately prior to procedure a time out was called: yes     Patient identity confirmed:  Verbally with patient and arm band Pre-procedure details:    Distal neurologic exam:  Normal   Distal perfusion: brisk capillary refill   Procedure details:    Location:  Wrist   Wrist location:  R wrist   Strapping: no     Splint type:  Thumb spica   Supplies:  Elastic bandage and fiberglass Post-procedure details:    Distal neurologic exam:  Unchanged   Distal perfusion: unchanged     Procedure completion:  Tolerated well, no immediate complications   Post-procedure imaging: not applicable       Medications Ordered in ED Medications  ibuprofen (ADVIL) tablet 800 mg (800 mg Oral Given 10/23/22 2321)    ED Course/ Medical Decision Making/ A&P                           Medical Decision Making Amount and/or Complexity of Data Reviewed Radiology: ordered.  Risk Prescription drug management.   Fall with right wrist injury concerning for scaphoid fracture.  X-rays of the right wrist confirm nondisplaced fracture of the right scaphoid.  I have ordered a thumb spica splint be applied.  I discussed the case with Dr. Grandville Silos, on-call for hand surgery, who requests the splint be maintained until he is seen in the office, his office will call in the morning for a  follow-up appointment.  I have advised him on ice and elevation, over-the-counter NSAIDs and acetaminophen as needed for pain.  I have also given him a prescription for a small number of oxycodone tablets to use for more severe pain.  Final Clinical Impression(s) / ED Diagnoses Final diagnoses:  Fall from slip, trip, or stumble, initial encounter  Closed nondisplaced fracture of scaphoid of right wrist, unspecified portion of scaphoid, initial encounter    Rx / DC Orders ED Discharge Orders          Ordered  oxyCODONE (ROXICODONE) 5 MG immediate release tablet  Every 4 hours PRN        10/24/22 7035              Delora Fuel, MD 00/93/81 [phone]

## 2022-10-24 NOTE — Discharge Instructions (Addendum)
Apply ice for 30 minutes at a time, 4 times a day.  Keep your hand elevated is much as possible.  Keep the splint on at all times until cleared to remove it by the hand specialist.  The hand specialist says his office will call you with an appointment in the morning.  If you do not hear from them by noon, call them for a follow-up appointment.  Acetaminophen and/or ibuprofen as needed for pain.  For severe pain, you may add oxycodone.

## 2022-10-28 ENCOUNTER — Other Ambulatory Visit (HOSPITAL_BASED_OUTPATIENT_CLINIC_OR_DEPARTMENT_OTHER): Payer: Self-pay | Admitting: Emergency Medicine

## 2022-10-28 ENCOUNTER — Other Ambulatory Visit (HOSPITAL_BASED_OUTPATIENT_CLINIC_OR_DEPARTMENT_OTHER): Payer: Self-pay

## 2022-10-29 ENCOUNTER — Other Ambulatory Visit (HOSPITAL_BASED_OUTPATIENT_CLINIC_OR_DEPARTMENT_OTHER): Payer: Self-pay

## 2022-10-29 ENCOUNTER — Other Ambulatory Visit (HOSPITAL_BASED_OUTPATIENT_CLINIC_OR_DEPARTMENT_OTHER): Payer: Self-pay | Admitting: Emergency Medicine

## 2022-11-06 NOTE — Telephone Encounter (Signed)
Not appropriate for refill

## 2022-11-07 ENCOUNTER — Other Ambulatory Visit (HOSPITAL_BASED_OUTPATIENT_CLINIC_OR_DEPARTMENT_OTHER): Payer: Self-pay

## 2022-11-07 ENCOUNTER — Encounter (HOSPITAL_BASED_OUTPATIENT_CLINIC_OR_DEPARTMENT_OTHER): Payer: Self-pay | Admitting: Urology

## 2022-11-12 ENCOUNTER — Encounter (HOSPITAL_BASED_OUTPATIENT_CLINIC_OR_DEPARTMENT_OTHER): Payer: Self-pay | Admitting: Urology

## 2022-11-12 NOTE — Progress Notes (Signed)
Spoke w/ via phone for pre-op interview--- pt Lab needs dos----   cmp            Lab results------  current EKG in epic/ chart COVID test -----patient states asymptomatic no test needed Arrive at -------  0700 on 11-12-2022 NPO after MN NO Solid Food.  Clear liquids from MN until--- 0600 Med rec completed Medications to take morning of surgery ----- flecainade, toprol, zoloft , megstrol, prilosec Diabetic medication ----- n/a Patient instructed no nail polish to be worn day of surgery Patient instructed to bring photo id and insurance card day of surgery Patient aware to have Driver (ride ) / caregiver    for 24 hours after surgery -- wife, angela Patient Special Instructions ----- will do one one fleet enema night before surgery.  Asked to bring rescue dos Pre-Op special Istructions ----- n/a Patient verbalized understanding of instructions that were given at this phone interview. Patient denies shortness of breath, chest pain, fever, cough at this phone interview.   Anesthesia Review:  HTN;  PAF;  Asthma;  hx IV drug use (pt stated last used drugs 08-29-2015);  Chronic Hepatitis C first dx 2008 (per pt scheduled to start treatment next month);  panic disorder;  Alcohol abuse (pt stated never had withdrawal symptoms)  PCP:  Dr Evette Georges Cardiologist :  [facility]  Clint Fenton PA  (lov 08-07-2022) Chest x-ray : 01-31-2020 EKG :  08-07-2022 Echo : 03-23-2019 Stress test: no Cardiac Cath : no Activity level: denies sob w/ any activity Sleep Study/ CPAP :  no (pt stated schedule for study next month)  Blood Thinner/ Instructions /Last Dose: no ASA / Instructions/ Last Dose : no

## 2022-11-13 NOTE — H&P (Signed)
Christian Mitchell is a 62 year old male seen in follow-up with high risk localized prostate cancer.   1. High risk localized prostate cancer:  -He was diagnosed in 02/2020 grade group 1, Gleason score 3+3 = 6 in 2/12 cores (10%) in [facility] with prebiopsy PSA 4.3. He states his PSA was drawn as he was experiencing signs and symptoms of low testosterone.  -He was offered prostatectomy and radiation however never followed up.  -I met him in 11/2021 with PSA of 5.84.  -Prostate MRI 02/19/2022 with PI-RADS category 1, volume 22 cm.  Patient underwent prostate biopsy on 06/25/2022 for an elevated PSA of 5.84 ng/mL. Biopsy revealed GS 4+4 = 8 in 1 core, 4+3 = 7 in 1 core and 3+3 = 6 in 1 core, adenocarcinoma of the prostate with 3/12 cores positive (20-40%), TRUS volume of 12 cm3.  Treatment: He has elected proceed with EBRT as he states he cannot take time off to undergo recovery from prostatectomy. His testosterone on 08/28/2022 was low at 116 and thus we are not planning for ADT.  Denies new or worsening bone or back pain. Good appetite and stable weight.   Family history: His father was diagnosed with prostate cancer in his 28s and underwent radical prostatectomy  Imaging studies: Prostate MRI 02/19/2022 with PI-RADS category 1, volume 22 cm   PMH: He does have a past medical history of atrial fibrillation. He denies taking anticoagulation  PSH: He does have a history of skin cancer underwent resection.   TNM stage: cT1cNxMx  PSA: 5.84  Gleason score: 4+4 = 8  Biopsy: 06/25/2022  Left: 4+4 = 8 in the left lateral apex, 4+3 = 7 in the left apex  Right: 3 posterior = 6 in the right lateral apex  Prostate volume: 12 cm  PSAD: 0.486   Nomogram  CSS (15-year): 90%  PFS (5 year, 10 year): 59%, 43%  EPE: 40%  LNI: 9%  SVI: 7%   #2. BPH/LUTS: His IPSS score is 18, quality-of-life 2. He takes tamsulosin 0.4 mg daily with benefit.   #3. Erectile dysfunction: His SHIM score is 13. He rates 3/5  confidence that he can get and keep an erection satisfactory for intercourse.   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   Patient currently denies fever, chills, sweats, nausea, vomiting, abdominal or flank pain, gross hematuria or dysuria.     ALLERGIES: No Known Drug Allergies    MEDICATIONS: Tamsulosin Hcl 0.4 mg capsule 1 capsule PO Q HS  Tamsulosin Hcl 0.4 mg capsule  Diltiazem Hcl 30 mg tablet  Flecainide Acetate  Toprol Xl 25 mg tablet, extended release 24 hr  Zoloft 50 mg tablet     GU PSH: Prostate Needle Biopsy - 06/25/2022       PSH Notes: Hand Repair   NON-GU PSH: Surgical Pathology, Gross And Microscopic Examination For Prostate Needle - 06/25/2022     GU PMH: ED due to arterial insufficiency - 07/02/2022, - 06/25/2022, - 03/12/2022, - 11/26/2021 Prostate Cancer - 07/02/2022, - 06/25/2022, - 03/12/2022, - 11/26/2021 Urinary Frequency - 07/02/2022, - 03/12/2022, - 11/26/2021      PMH Notes:  1898-10-21 00:00:00 - Note: Normal Routine History And Physical Adult  2012-03-03 14:25:58 - Note: Chronic Reflux Esophagitis  2012-03-03 16:38:37 - Note: Flank Pain Left  2012-03-03 14:25:58 - Note: Arthritis   NON-GU PMH: Anxiety, Anxiety (Symptom) - 2014 Personal history of other infectious and  parasitic diseases, History of hepatitis - 2014 Arthritis Atrial Fibrillation GERD Hepatitis C Liver Disease    FAMILY HISTORY: 1 Daughter - Runs in Family 1 son - Runs in Family Cholelithiasis - Father Diabetes - Mother Family Health Status - Mother's Age - Runs In Family Family Health Status Father - Runs In Family Family Health Status Number - Runs In Family Prostate Cancer - Father   SOCIAL HISTORY: Marital Status: Married Ethnicity: Not Hispanic Or Latino; Race: White Current Smoking Status: Patient smokes. Has smoked since 11/21/1976.   Tobacco Use Assessment Completed: Used Tobacco in last 30 days? Does drink.  Drinks 2 caffeinated  drinks per day.     Notes: Tobacco use, Occupation:, Marital History - Currently Married, Caffeine Use, Alcohol Use   REVIEW OF SYSTEMS:    GU Review Male:   Patient denies frequent urination, hard to postpone urination, burning/ pain with urination, get up at night to urinate, leakage of urine, stream starts and stops, trouble starting your stream, have to strain to urinate , erection problems, and penile pain.  Gastrointestinal (Upper):   Patient denies nausea, vomiting, and indigestion/ heartburn.  Gastrointestinal (Lower):   Patient denies diarrhea and constipation.  Constitutional:   Patient denies fever, night sweats, weight loss, and fatigue.  Skin:   Patient denies skin rash/ lesion and itching.  Eyes:   Patient denies blurred vision and double vision.  Ears/ Nose/ Throat:   Patient denies sore throat and sinus problems.  Hematologic/Lymphatic:   Patient denies swollen glands and easy bruising.  Cardiovascular:   Patient denies leg swelling and chest pains.  Respiratory:   Patient denies cough and shortness of breath.  Endocrine:   Patient denies excessive thirst.  Musculoskeletal:   Patient denies back pain and joint pain.  Neurological:   Patient denies dizziness and headaches.  Psychologic:   Patient denies depression and anxiety.     MULTI-SYSTEM PHYSICAL EXAMINATION:    Constitutional: Well-nourished. No physical deformities. Normally developed. Good grooming.  Respiratory: No labored breathing, no use of accessory muscles.   Cardiovascular: Normal temperature, normal extremity pulses, no swelling, no varicosities.  Gastrointestinal: No mass, no tenderness, no rigidity, non obese abdomen.        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 High risk localized prostate cancer:  -TRUSP 02/2020 with GG1, Gleason score 3+3 = 6 in 2/12 cores (10%) in [facility] with prebiopsy PSA 4.3.  -TRUSP 06/2022 with GG4, cT1cNxMx GS 4+4 = 8 in 1 core, 4+3 = 7 in 1 core and 3+3 = 6 in 1 core, adenocarcinoma of the prostate with 3/12 cores positive (20-40%), TRUS volume of 12 cm3, prebiopsy PSA = 5.48 ng/mL  -Although given his age, I recommended prostatectomy. He states that he would be unable to take time off of his busy work schedule. He has met with Dr. Tammi Klippel and has elected proceed with EBRT with space OAR. Given that he has low testosterone, we are not planning for ADT upfront.  -He understands risk and benefits. Surgery letter has been sent for fiducial markers and space OAR.   Discussion:  Using NCCN risk stratification criteria, his disease is considered high risk. We discussed his diagnosis in detail, reviewing the significance of gleason score, PSA, DRE, and  percentage of cores positive. We discussed the various management options for prostate cancer, including active surveillance, open and robotic radical prostatectomy, EBRT, brachytherapy, proton therapy, and cryotherapy. We discussed the generally indolent course of many prostate cancers, and the generally favorable oncologic control offered by all treatment strategies. However, I emphasized that all treatments offer only potential for cure and that in many cases multimodal therapy may ultimately be utilized for long term cancer control. We also discussed the impact of treatment on sexual and urinary function. I emphasized that each treatment has unique quality of life impact and recovery profiles, and we reviewed the possible effects of surgery, radiation, and cryotherapy on quality of life outcomes. All questions were answered.   He has had extensive counseling on treatment options and r/b of each. We again reviewed r/b of surgery including but not limited to impact on QOL-- most prominently on urinary, bowel, and sexual function. We discussed potential for short and long term urinary incontinence and  erectile dysfunction. We discussed potential for surgical complications including infection, bleeding, blood transfusion, injury to urinary and surrounding structures including rectum which could require primary repair or diverting colostomy, vascular injury, neuromuscular injury related to positioning, penile shortening, and bladder neck contracture. We discussed the possibility of positive margins and possible need for adjuvant or salvage therapies in the future. We discussed other perioperative risks including DVT, PE, CVA, MI, pneumonia, and death. He is well informed and all questions were answered.   #2. BPH/LUTS: continue tamsulosin

## 2022-11-14 ENCOUNTER — Ambulatory Visit (HOSPITAL_BASED_OUTPATIENT_CLINIC_OR_DEPARTMENT_OTHER): Payer: 59 | Admitting: Anesthesiology

## 2022-11-14 ENCOUNTER — Other Ambulatory Visit: Payer: Self-pay

## 2022-11-14 ENCOUNTER — Ambulatory Visit (HOSPITAL_BASED_OUTPATIENT_CLINIC_OR_DEPARTMENT_OTHER)
Admission: RE | Admit: 2022-11-14 | Discharge: 2022-11-14 | Disposition: A | Discharge status: Home / Self Care | Payer: 59 | Source: Ambulatory Visit | Attending: Urology | Admitting: Urology

## 2022-11-14 ENCOUNTER — Encounter (HOSPITAL_BASED_OUTPATIENT_CLINIC_OR_DEPARTMENT_OTHER)
Admission: RE | Disposition: A | Discharge status: Home / Self Care | Payer: Self-pay | Source: Ambulatory Visit | Attending: Urology

## 2022-11-14 ENCOUNTER — Encounter (HOSPITAL_BASED_OUTPATIENT_CLINIC_OR_DEPARTMENT_OTHER): Payer: Self-pay | Admitting: Urology

## 2022-11-14 DIAGNOSIS — J45909 Unspecified asthma, uncomplicated: Secondary | ICD-10-CM

## 2022-11-14 DIAGNOSIS — Z87891 Personal history of nicotine dependence: Secondary | ICD-10-CM | POA: Diagnosis not present

## 2022-11-14 DIAGNOSIS — I10 Essential (primary) hypertension: Secondary | ICD-10-CM

## 2022-11-14 DIAGNOSIS — R69 Illness, unspecified: Secondary | ICD-10-CM | POA: Diagnosis not present

## 2022-11-14 DIAGNOSIS — C61 Malignant neoplasm of prostate: Secondary | ICD-10-CM | POA: Diagnosis not present

## 2022-11-14 DIAGNOSIS — Z01818 Encounter for other preprocedural examination: Secondary | ICD-10-CM

## 2022-11-14 HISTORY — DX: Opioid abuse, in remission: F11.11

## 2022-11-14 HISTORY — DX: Sleepwalking (somnambulism): F51.3

## 2022-11-14 HISTORY — DX: Personal history of urinary calculi: Z87.442

## 2022-11-14 HISTORY — DX: Essential (primary) hypertension: I10

## 2022-11-14 HISTORY — PX: SPACE OAR INSTILLATION: SHX6769

## 2022-11-14 HISTORY — PX: GOLD SEED IMPLANT: SHX6343

## 2022-11-14 LAB — COMPREHENSIVE METABOLIC PANEL
ALT: 86 U/L — ABNORMAL HIGH (ref 0–44)
AST: 88 U/L — ABNORMAL HIGH (ref 15–41)
Albumin: 4.2 g/dL (ref 3.5–5.0)
Alkaline Phosphatase: 101 U/L (ref 38–126)
Anion gap: 8 (ref 5–15)
BUN: 20 mg/dL (ref 6–20)
CO2: 25 mmol/L (ref 22–32)
Calcium: 9.1 mg/dL (ref 8.9–10.3)
Chloride: 104 mmol/L (ref 98–111)
Creatinine, Ser: 0.94 mg/dL (ref 0.61–1.24)
GFR, Estimated: >60 mL/min (ref >60)
Glucose, Bld: 96 mg/dL (ref 70–99)
Potassium: 3.7 mmol/L (ref 3.5–5.1)
Sodium: 137 mmol/L (ref 135–145)
Total Bilirubin: 0.9 mg/dL (ref 0.3–1.2)
Total Protein: 7.3 g/dL (ref 6.5–8.1)

## 2022-11-14 SURGERY — INSERTION, GOLD SEEDS
Anesthesia: Monitor Anesthesia Care | Site: Prostate

## 2022-11-14 MED ORDER — CEFAZOLIN SODIUM-DEXTROSE 2-4 GM/100ML-% IV SOLN
INTRAVENOUS | Status: AC
  Filled 2022-11-14: qty 100

## 2022-11-14 MED ORDER — MIDAZOLAM HCL 2 MG/2ML IJ SOLN
INTRAMUSCULAR | Status: AC
  Filled 2022-11-14: qty 2

## 2022-11-14 MED ORDER — BUPIVACAINE HCL 0.25 % IJ SOLN
INTRAMUSCULAR | Status: DC | PRN
  Administered 2022-11-14: 10 mL

## 2022-11-14 MED ORDER — FENTANYL CITRATE (PF) 100 MCG/2ML IJ SOLN
INTRAMUSCULAR | Status: DC | PRN
  Administered 2022-11-14 (×2): 50 ug via INTRAVENOUS

## 2022-11-14 MED ORDER — LACTATED RINGERS IV SOLN: INTRAVENOUS | Status: DC

## 2022-11-14 MED ORDER — PROPOFOL 10 MG/ML IV BOLUS
INTRAVENOUS | Status: DC | PRN
  Administered 2022-11-14: 20 mg via INTRAVENOUS
  Administered 2022-11-14: 30 mg via INTRAVENOUS
  Administered 2022-11-14: 20 mg via INTRAVENOUS

## 2022-11-14 MED ORDER — KETAMINE HCL 10 MG/ML IJ SOLN
INTRAMUSCULAR | Status: DC | PRN
  Administered 2022-11-14: 50 mg via INTRAVENOUS

## 2022-11-14 MED ORDER — ONDANSETRON HCL 4 MG/2ML IJ SOLN
INTRAMUSCULAR | Status: DC | PRN
  Administered 2022-11-14: 4 mg via INTRAVENOUS

## 2022-11-14 MED ORDER — SODIUM CHLORIDE (PF) 0.9 % IJ SOLN
INTRAMUSCULAR | Status: DC | PRN
  Administered 2022-11-14: 10 mL

## 2022-11-14 MED ORDER — KETAMINE HCL 50 MG/5ML IJ SOSY
PREFILLED_SYRINGE | INTRAMUSCULAR | Status: AC
  Filled 2022-11-14: qty 5

## 2022-11-14 MED ORDER — MIDAZOLAM HCL 5 MG/5ML IJ SOLN
INTRAMUSCULAR | Status: DC | PRN
  Administered 2022-11-14: 2 mg via INTRAVENOUS

## 2022-11-14 MED ORDER — FENTANYL CITRATE (PF) 100 MCG/2ML IJ SOLN
INTRAMUSCULAR | Status: AC
  Filled 2022-11-14: qty 2

## 2022-11-14 MED ORDER — FLEET ENEMA 7-19 GM/118ML RE ENEM: 1.0000 | ENEMA | Freq: Once | RECTAL | Status: DC

## 2022-11-14 MED ORDER — PROPOFOL 500 MG/50ML IV EMUL
INTRAVENOUS | Status: DC | PRN
  Administered 2022-11-14: 250 ug/kg/min via INTRAVENOUS

## 2022-11-14 MED ORDER — DEXMEDETOMIDINE HCL IN NACL 200 MCG/50ML IV SOLN
INTRAVENOUS | Status: DC | PRN
  Administered 2022-11-14: 8 ug via INTRAVENOUS

## 2022-11-14 MED ORDER — PROPOFOL 500 MG/50ML IV EMUL
INTRAVENOUS | Status: AC
  Filled 2022-11-14: qty 50

## 2022-11-14 MED ORDER — CEFAZOLIN SODIUM-DEXTROSE 2-4 GM/100ML-% IV SOLN
2.0000 g | INTRAVENOUS | Status: AC
  Administered 2022-11-14: 2 g via INTRAVENOUS

## 2022-11-14 MED ORDER — DEXMEDETOMIDINE HCL IN NACL 80 MCG/20ML IV SOLN
INTRAVENOUS | Status: AC
  Filled 2022-11-14: qty 20

## 2022-11-14 MED ORDER — PROPOFOL 10 MG/ML IV BOLUS
INTRAVENOUS | Status: AC
  Filled 2022-11-14: qty 20

## 2022-11-14 SURGICAL SUPPLY — 23 items

## 2022-11-14 NOTE — Discharge Instructions (Addendum)
You should avoid strenuous activities today but may resume all normal activities tomorrow.  2.   You can take Tylenol as needed for any pain or discomfort.  3.    Follow up with your radiation oncologist for your simulation appointment as scheduled.  If this is not currently scheduled or you do not know the date/time for that appointment, please contact the radiation oncology office to confirm.    Post Anesthesia Home Care Instructions  Activity: Get plenty of rest for the remainder of the day. A responsible individual must stay with you for 24 hours following the procedure.  For the next 24 hours, DO NOT: -Drive a car -Operate machinery -Drink alcoholic beverages -Take any medication unless instructed by your physician -Make any legal decisions or sign important papers.  Meals: Start with liquid foods such as gelatin or soup. Progress to regular foods as tolerated. Avoid greasy, spicy, heavy foods. If nausea and/or vomiting occur, drink only clear liquids until the nausea and/or vomiting subsides. Call your physician if vomiting continues.  Special Instructions/Symptoms: Your throat may feel dry or sore from the anesthesia or the breathing tube placed in your throat during surgery. If this causes discomfort, gargle with warm salt water. The discomfort should disappear within 24 hours.  

## 2022-11-14 NOTE — Anesthesia Preprocedure Evaluation (Signed)
Anesthesia Evaluation  Patient identified by MRN, date of birth, ID band Patient awake    Reviewed: Allergy & Precautions, H&P , NPO status , Patient's Chart, lab work & pertinent test results  Airway Mallampati: II   Neck ROM: full    Dental   Pulmonary asthma , former smoker   breath sounds clear to auscultation       Cardiovascular hypertension, + dysrhythmias Atrial Fibrillation  Rhythm:regular Rate:Normal     Neuro/Psych  PSYCHIATRIC DISORDERS Anxiety      Neuromuscular disease    GI/Hepatic ,GERD  ,,(+)     substance abuse  , Hepatitis -, C  Endo/Other    Renal/GU      Musculoskeletal  (+) Arthritis ,    Abdominal   Peds  Hematology   Anesthesia Other Findings   Reproductive/Obstetrics                             Anesthesia Physical Anesthesia Plan  ASA: 2  Anesthesia Plan: MAC   Post-op Pain Management:    Induction: Intravenous  PONV Risk Score and Plan: 1 and Propofol infusion, Treatment may vary due to age or medical condition, Midazolam and Ondansetron  Airway Management Planned: Simple Face Mask  Additional Equipment:   Intra-op Plan:   Post-operative Plan:   Informed Consent: I have reviewed the patients History and Physical, chart, labs and discussed the procedure including the risks, benefits and alternatives for the proposed anesthesia with the patient or authorized representative who has indicated his/her understanding and acceptance.     Dental advisory given  Plan Discussed with: CRNA, Anesthesiologist and Surgeon  Anesthesia Plan Comments:        Anesthesia Quick Evaluation

## 2022-11-14 NOTE — Transfer of Care (Signed)
Immediate Anesthesia Transfer of Care Note  Patient: Christian Mitchell  Procedure(s) Performed: GOLD SEED IMPLANT (Prostate) SPACE OAR INSTILLATION (Prostate)  Patient Location: PACU  Anesthesia Type:MAC  Level of Consciousness: awake, alert , oriented, and patient cooperative  Airway & Oxygen Therapy: Patient Spontanous Breathing  Post-op Assessment: Report given to RN and Post -op Vital signs reviewed and stable  Post vital signs: Reviewed and stable  Last Vitals:  Vitals Value Taken Time  BP 154/95 11/14/22 0945  Temp 37.1 C 11/14/22 0945  Pulse 79 11/14/22 0949  Resp 30 11/14/22 0949  SpO2 97 % 11/14/22 0949  Vitals shown include unvalidated device data.  Last Pain:  Vitals:   11/14/22 0747  TempSrc: Oral  PainSc: 2          Complications: No notable events documented.

## 2022-11-14 NOTE — Op Note (Signed)
Preoperative diagnosis: Prostate cancer   Postoperative diagnosis: Prostate cancer   Procedure:  1) Fiducial marker placement into the prostate 2) Insertion of SpaceOAR hydrogel    Surgeon: Pryor Curia. M.D.   Anesthesia: IV sedation, Local anesthesia   EBL: Minimal   Complications: None   Indication: The potential risks, complications, alternative options, and expected recovery course associated with the above procedure(s) have been discussed in detail with the patient and he has provided informed consent to proceed.   Description of procedure: The patient was administered preoperative antibiotics, placed in the dorsal lithotomy position, and prepped and draped in the usual sterile fashion. A preoperative time out was performed.  Next, transrectal ultrasonography was utilized to visualize the prostate. 10 cc of 0.25% bupivacaine was then used to infiltrate the subcuateous tissue of the perineum and an additional 10 cc was injected into the lateral apical tissue surrounding the prostate for a periprostatic nerve block.  Three gold fiducial markers were then placed into the prostate via transperineal needles under ultrasound guidance at the right apex, right base, and left mid gland under direct ultrasound guidance.  A site in the midline was then selected on the perineum for placement of an 18 g needle with saline.  The needle was advanced above the rectum and below Denonvillier's fascia to the mid gland and confirmed to be in the midline on transverse imaging.  One cc of saline was injected confirming appropriate expansion of this space.  A total of 5-10 cc of saline was then injected to open the space further bilaterally.  The saline syringe was then removed and the SpaceOAR hydrogel was injected with good distribution bilaterally. He tolerated the procedure well and without complications. He was able to be awakened and transferred to the PACU in stable condition.

## 2022-11-14 NOTE — Anesthesia Procedure Notes (Signed)
Procedure Name: MAC Date/Time: 11/14/2022 9:18 AM  Performed by: Rogers Blocker, CRNAPre-anesthesia Checklist: Patient identified, Emergency Drugs available, Suction available and Patient being monitored Patient Re-evaluated:Patient Re-evaluated prior to induction Oxygen Delivery Method: Simple face mask Placement Confirmation: positive ETCO2

## 2022-11-15 ENCOUNTER — Encounter (HOSPITAL_BASED_OUTPATIENT_CLINIC_OR_DEPARTMENT_OTHER): Payer: Self-pay | Admitting: Urology

## 2022-11-15 NOTE — Anesthesia Postprocedure Evaluation (Signed)
Anesthesia Post Note  Patient: Christian Mitchell  Procedure(s) Performed: GOLD SEED IMPLANT (Prostate) SPACE OAR INSTILLATION (Prostate)     Patient location during evaluation: PACU Anesthesia Type: MAC Level of consciousness: awake and alert Pain management: pain level controlled Vital Signs Assessment: post-procedure vital signs reviewed and stable Respiratory status: spontaneous breathing, nonlabored ventilation, respiratory function stable and patient connected to nasal cannula oxygen Cardiovascular status: stable and blood pressure returned to baseline Postop Assessment: no apparent nausea or vomiting Anesthetic complications: no   No notable events documented.  Last Vitals:  Vitals:   11/14/22 1000 11/14/22 1015  BP: (!) 152/91 (!) 161/99  Pulse: 78 76  Resp:    Temp:  36.6 C  SpO2: 95% 97%    Last Pain:  Vitals:   11/15/22 1027  TempSrc:   PainSc: 0-No pain                 Yanis Juma S

## 2022-11-19 ENCOUNTER — Telehealth: Payer: Self-pay | Admitting: *Deleted

## 2022-11-19 NOTE — Telephone Encounter (Signed)
Returned patient's phone call, lvm for a return call 

## 2022-11-19 NOTE — Telephone Encounter (Signed)
Called patient to remind of sim appt. for 11-21-22- arrival time- 9:45 am @ [facility], informed patient to arrive with a full bladder, lvm for a return call

## 2022-11-20 NOTE — Progress Notes (Signed)
  Radiation Oncology         [phone] ________________________________  Name: Christian Mitchell MRN: [id number]  Date: 11/21/2022  DOB: 08/12/1961  SIMULATION AND TREATMENT PLANNING NOTE    ICD-10-CM   1. Malignant neoplasm of prostate ([place])  C61       DIAGNOSIS:  62 y.o. gentleman with Stage T1c adenocarcinoma of the prostate with Gleason Score of 4+4, and PSA of 5.84.   NARRATIVE:  The patient was brought to the [facility].  Identity was confirmed.  All relevant records and images related to the planned course of therapy were reviewed.  The patient freely provided informed written consent to proceed with treatment after reviewing the details related to the planned course of therapy. The consent form was witnessed and verified by the simulation staff.  Then, the patient was set-up in a stable reproducible supine position for radiation therapy.  A vacuum lock pillow device was custom fabricated to position his legs in a reproducible immobilized position.  Then, I performed a urethrogram under sterile conditions to identify the prostatic apex.  CT images were obtained.  Surface markings were placed.  The CT images were loaded into the planning software.  Then the prostate target and avoidance structures including the rectum, bladder, bowel and hips were contoured.  Treatment planning then occurred.  The radiation prescription was entered and confirmed.  A total of one complex treatment devices was fabricated. I have requested : Intensity Modulated Radiotherapy (IMRT) is medically necessary for this case for the following reason:  Rectal sparing.Marland Kitchen  PLAN:   The prostate, seminal vesicles, and pelvic lymph nodes will initially be treated to 45 Gy in 25 fractions of 1.8 Gy followed by a boost to the prostate only, to 75 Gy with 15 additional fractions of 2.0 Gy   ________________________________  Sheral Apley Tammi Klippel, M.D.

## 2022-11-21 ENCOUNTER — Ambulatory Visit
Admission: RE | Admit: 2022-11-21 | Discharge: 2022-11-21 | Disposition: A | Discharge status: Home / Self Care | Payer: 59 | Source: Ambulatory Visit | Attending: Radiation Oncology | Admitting: Radiation Oncology

## 2022-11-21 DIAGNOSIS — Z191 Hormone sensitive malignancy status: Secondary | ICD-10-CM | POA: Diagnosis not present

## 2022-11-21 DIAGNOSIS — Z510 Encounter for antineoplastic radiation therapy: Secondary | ICD-10-CM | POA: Insufficient documentation

## 2022-11-21 DIAGNOSIS — C61 Malignant neoplasm of prostate: Secondary | ICD-10-CM

## 2022-11-25 DIAGNOSIS — C61 Malignant neoplasm of prostate: Secondary | ICD-10-CM | POA: Diagnosis not present

## 2022-11-25 DIAGNOSIS — Z191 Hormone sensitive malignancy status: Secondary | ICD-10-CM | POA: Diagnosis not present

## 2022-11-25 DIAGNOSIS — Z510 Encounter for antineoplastic radiation therapy: Secondary | ICD-10-CM | POA: Diagnosis not present

## 2022-11-27 ENCOUNTER — Other Ambulatory Visit (HOSPITAL_BASED_OUTPATIENT_CLINIC_OR_DEPARTMENT_OTHER): Payer: Self-pay

## 2022-11-27 MED ORDER — SERTRALINE HCL 50 MG PO TABS
50.0000 mg | ORAL_TABLET | Freq: Every day | ORAL | 0 refills | Status: DC
  Filled 2022-11-27: qty 30, 30d supply, fill #0

## 2022-12-02 ENCOUNTER — Other Ambulatory Visit: Payer: Self-pay

## 2022-12-02 ENCOUNTER — Other Ambulatory Visit (HOSPITAL_BASED_OUTPATIENT_CLINIC_OR_DEPARTMENT_OTHER): Payer: Self-pay

## 2022-12-02 ENCOUNTER — Ambulatory Visit
Admission: RE | Admit: 2022-12-02 | Discharge: 2022-12-02 | Disposition: A | Discharge status: Home / Self Care | Payer: 59 | Source: Ambulatory Visit | Attending: Radiation Oncology | Admitting: Radiation Oncology

## 2022-12-02 DIAGNOSIS — C61 Malignant neoplasm of prostate: Secondary | ICD-10-CM | POA: Diagnosis not present

## 2022-12-02 DIAGNOSIS — Z510 Encounter for antineoplastic radiation therapy: Secondary | ICD-10-CM | POA: Diagnosis not present

## 2022-12-02 DIAGNOSIS — Z191 Hormone sensitive malignancy status: Secondary | ICD-10-CM | POA: Diagnosis not present

## 2022-12-02 LAB — RAD ONC ARIA SESSION SUMMARY
Course Elapsed Days: 0
Plan Fractions Treated to Date: 1
Plan Prescribed Dose Per Fraction: 1.8 Gy
Plan Total Fractions Prescribed: 25
Plan Total Prescribed Dose: 45 Gy
Reference Point Dosage Given to Date: 1.8 Gy
Reference Point Session Dosage Given: 1.8 Gy
Session Number: 1

## 2022-12-03 ENCOUNTER — Other Ambulatory Visit: Payer: Self-pay

## 2022-12-03 ENCOUNTER — Ambulatory Visit
Admission: RE | Admit: 2022-12-03 | Discharge: 2022-12-03 | Disposition: A | Discharge status: Home / Self Care | Payer: 59 | Source: Ambulatory Visit | Attending: Radiation Oncology | Admitting: Radiation Oncology

## 2022-12-03 DIAGNOSIS — C61 Malignant neoplasm of prostate: Secondary | ICD-10-CM | POA: Diagnosis not present

## 2022-12-03 DIAGNOSIS — Z510 Encounter for antineoplastic radiation therapy: Secondary | ICD-10-CM | POA: Diagnosis not present

## 2022-12-03 DIAGNOSIS — Z191 Hormone sensitive malignancy status: Secondary | ICD-10-CM | POA: Diagnosis not present

## 2022-12-03 LAB — RAD ONC ARIA SESSION SUMMARY
Course Elapsed Days: 1
Plan Fractions Treated to Date: 2
Plan Prescribed Dose Per Fraction: 1.8 Gy
Plan Total Fractions Prescribed: 25
Plan Total Prescribed Dose: 45 Gy
Reference Point Dosage Given to Date: 3.6 Gy
Reference Point Session Dosage Given: 1.8 Gy
Session Number: 2

## 2022-12-04 ENCOUNTER — Ambulatory Visit
Admission: RE | Admit: 2022-12-04 | Discharge: 2022-12-04 | Disposition: A | Discharge status: Home / Self Care | Payer: 59 | Source: Ambulatory Visit | Attending: Radiation Oncology | Admitting: Radiation Oncology

## 2022-12-04 ENCOUNTER — Other Ambulatory Visit: Payer: Self-pay

## 2022-12-04 DIAGNOSIS — C61 Malignant neoplasm of prostate: Secondary | ICD-10-CM | POA: Diagnosis not present

## 2022-12-04 DIAGNOSIS — Z191 Hormone sensitive malignancy status: Secondary | ICD-10-CM | POA: Diagnosis not present

## 2022-12-04 DIAGNOSIS — Z510 Encounter for antineoplastic radiation therapy: Secondary | ICD-10-CM | POA: Diagnosis not present

## 2022-12-04 LAB — RAD ONC ARIA SESSION SUMMARY
Course Elapsed Days: 2
Plan Fractions Treated to Date: 3
Plan Prescribed Dose Per Fraction: 1.8 Gy
Plan Total Fractions Prescribed: 25
Plan Total Prescribed Dose: 45 Gy
Reference Point Dosage Given to Date: 5.4 Gy
Reference Point Session Dosage Given: 1.8 Gy
Session Number: 3

## 2022-12-05 ENCOUNTER — Other Ambulatory Visit: Payer: Self-pay

## 2022-12-05 ENCOUNTER — Ambulatory Visit
Admission: RE | Admit: 2022-12-05 | Discharge: 2022-12-05 | Disposition: A | Discharge status: Home / Self Care | Payer: 59 | Source: Ambulatory Visit | Attending: Radiation Oncology | Admitting: Radiation Oncology

## 2022-12-05 DIAGNOSIS — C61 Malignant neoplasm of prostate: Secondary | ICD-10-CM | POA: Diagnosis not present

## 2022-12-05 DIAGNOSIS — Z510 Encounter for antineoplastic radiation therapy: Secondary | ICD-10-CM | POA: Diagnosis not present

## 2022-12-05 DIAGNOSIS — Z191 Hormone sensitive malignancy status: Secondary | ICD-10-CM | POA: Diagnosis not present

## 2022-12-05 LAB — RAD ONC ARIA SESSION SUMMARY
Course Elapsed Days: 3
Plan Fractions Treated to Date: 4
Plan Prescribed Dose Per Fraction: 1.8 Gy
Plan Total Fractions Prescribed: 25
Plan Total Prescribed Dose: 45 Gy
Reference Point Dosage Given to Date: 7.2 Gy
Reference Point Session Dosage Given: 1.8 Gy
Session Number: 4

## 2022-12-06 ENCOUNTER — Other Ambulatory Visit: Payer: Self-pay

## 2022-12-06 ENCOUNTER — Ambulatory Visit
Admission: RE | Admit: 2022-12-06 | Discharge: 2022-12-06 | Disposition: A | Discharge status: Home / Self Care | Payer: 59 | Source: Ambulatory Visit | Attending: Radiation Oncology | Admitting: Radiation Oncology

## 2022-12-06 ENCOUNTER — Other Ambulatory Visit (HOSPITAL_BASED_OUTPATIENT_CLINIC_OR_DEPARTMENT_OTHER): Payer: Self-pay

## 2022-12-06 ENCOUNTER — Other Ambulatory Visit: Payer: Self-pay | Admitting: Radiation Oncology

## 2022-12-06 DIAGNOSIS — Z191 Hormone sensitive malignancy status: Secondary | ICD-10-CM | POA: Diagnosis not present

## 2022-12-06 DIAGNOSIS — C61 Malignant neoplasm of prostate: Secondary | ICD-10-CM | POA: Diagnosis not present

## 2022-12-06 DIAGNOSIS — Z510 Encounter for antineoplastic radiation therapy: Secondary | ICD-10-CM | POA: Diagnosis not present

## 2022-12-06 LAB — RAD ONC ARIA SESSION SUMMARY
Course Elapsed Days: 4
Plan Fractions Treated to Date: 5
Plan Prescribed Dose Per Fraction: 1.8 Gy
Plan Total Fractions Prescribed: 25
Plan Total Prescribed Dose: 45 Gy
Reference Point Dosage Given to Date: 9 Gy
Reference Point Session Dosage Given: 1.8 Gy
Session Number: 5

## 2022-12-06 MED ORDER — MEGESTROL ACETATE 40 MG PO TABS
40.0000 mg | ORAL_TABLET | Freq: Two times a day (BID) | ORAL | 0 refills | Status: DC
  Filled 2022-12-06: qty 60, 30d supply, fill #0
  Filled 2022-12-31: qty 60, 30d supply, fill #1
  Filled 2023-01-27: qty 60, 30d supply, fill #2

## 2022-12-06 NOTE — Progress Notes (Signed)
Pt here for patient teaching. Pt given Radiation and You booklet and skin care instructions.  Reviewed areas of pertinence such as diarrhea, fatigue, hair loss, nausea and vomiting, sexual and fertility changes, skin changes, and urinary and bladder changes. Pt able to give teach back of to pat skin, use unscented/gentle soap, use baby wipes, have Imodium on hand, drink plenty of water, and sitz bath, avoid applying anything to skin within 4 hours of treatment and to use an electric razor if they must shave. Pt verbalizes understanding of information given and will contact nursing with any questions or concerns.     [email or web address]

## 2022-12-09 ENCOUNTER — Other Ambulatory Visit: Payer: Self-pay

## 2022-12-09 ENCOUNTER — Ambulatory Visit
Admission: RE | Admit: 2022-12-09 | Discharge: 2022-12-09 | Disposition: A | Discharge status: Home / Self Care | Payer: 59 | Source: Ambulatory Visit | Attending: Radiation Oncology | Admitting: Radiation Oncology

## 2022-12-09 DIAGNOSIS — C61 Malignant neoplasm of prostate: Secondary | ICD-10-CM | POA: Diagnosis not present

## 2022-12-09 DIAGNOSIS — Z191 Hormone sensitive malignancy status: Secondary | ICD-10-CM | POA: Diagnosis not present

## 2022-12-09 DIAGNOSIS — Z510 Encounter for antineoplastic radiation therapy: Secondary | ICD-10-CM | POA: Diagnosis not present

## 2022-12-09 LAB — RAD ONC ARIA SESSION SUMMARY
Course Elapsed Days: 7
Plan Fractions Treated to Date: 6
Plan Prescribed Dose Per Fraction: 1.8 Gy
Plan Total Fractions Prescribed: 25
Plan Total Prescribed Dose: 45 Gy
Reference Point Dosage Given to Date: 10.8 Gy
Reference Point Session Dosage Given: 1.8 Gy
Session Number: 6

## 2022-12-10 ENCOUNTER — Other Ambulatory Visit: Payer: Self-pay

## 2022-12-10 ENCOUNTER — Ambulatory Visit
Admission: RE | Admit: 2022-12-10 | Discharge: 2022-12-10 | Disposition: A | Discharge status: Home / Self Care | Payer: 59 | Source: Ambulatory Visit | Attending: Radiation Oncology | Admitting: Radiation Oncology

## 2022-12-10 DIAGNOSIS — C61 Malignant neoplasm of prostate: Secondary | ICD-10-CM | POA: Diagnosis not present

## 2022-12-10 DIAGNOSIS — Z510 Encounter for antineoplastic radiation therapy: Secondary | ICD-10-CM | POA: Diagnosis not present

## 2022-12-10 DIAGNOSIS — Z191 Hormone sensitive malignancy status: Secondary | ICD-10-CM | POA: Diagnosis not present

## 2022-12-10 LAB — RAD ONC ARIA SESSION SUMMARY
Course Elapsed Days: 8
Plan Fractions Treated to Date: 7
Plan Prescribed Dose Per Fraction: 1.8 Gy
Plan Total Fractions Prescribed: 25
Plan Total Prescribed Dose: 45 Gy
Reference Point Dosage Given to Date: 12.6 Gy
Reference Point Session Dosage Given: 1.8 Gy
Session Number: 7

## 2022-12-11 ENCOUNTER — Other Ambulatory Visit: Payer: Self-pay

## 2022-12-11 ENCOUNTER — Ambulatory Visit
Admission: RE | Admit: 2022-12-11 | Discharge: 2022-12-11 | Disposition: A | Discharge status: Home / Self Care | Payer: 59 | Source: Ambulatory Visit | Attending: Radiation Oncology | Admitting: Radiation Oncology

## 2022-12-11 DIAGNOSIS — Z510 Encounter for antineoplastic radiation therapy: Secondary | ICD-10-CM | POA: Diagnosis not present

## 2022-12-11 DIAGNOSIS — C61 Malignant neoplasm of prostate: Secondary | ICD-10-CM | POA: Diagnosis not present

## 2022-12-11 DIAGNOSIS — Z191 Hormone sensitive malignancy status: Secondary | ICD-10-CM | POA: Diagnosis not present

## 2022-12-11 LAB — RAD ONC ARIA SESSION SUMMARY
Course Elapsed Days: 9
Plan Fractions Treated to Date: 8
Plan Prescribed Dose Per Fraction: 1.8 Gy
Plan Total Fractions Prescribed: 25
Plan Total Prescribed Dose: 45 Gy
Reference Point Dosage Given to Date: 14.4 Gy
Reference Point Session Dosage Given: 1.8 Gy
Session Number: 8

## 2022-12-11 NOTE — Progress Notes (Signed)
Patient came around to the nursing clinic following his radiation treatment with complaints of burning with urination, dizziness, inability to walk more than 100 feet without fatigue, blurry vision and occasional decreased peripheral vision.  He reports he is eating and drinking well.  Vitals are stable: 126/82, 80, 100%, 18.  Spoke with the doctor and he was advised to use over the counter AZO and to discontinue Flomax.  Patient verbalized understanding.  Gloriajean Dell. Leonie Green, BSN

## 2022-12-12 ENCOUNTER — Ambulatory Visit
Admission: RE | Admit: 2022-12-12 | Discharge: 2022-12-12 | Disposition: A | Discharge status: Home / Self Care | Payer: 59 | Source: Ambulatory Visit | Attending: Radiation Oncology | Admitting: Radiation Oncology

## 2022-12-12 ENCOUNTER — Other Ambulatory Visit (HOSPITAL_BASED_OUTPATIENT_CLINIC_OR_DEPARTMENT_OTHER): Payer: Self-pay

## 2022-12-12 ENCOUNTER — Other Ambulatory Visit: Payer: Self-pay

## 2022-12-12 DIAGNOSIS — C61 Malignant neoplasm of prostate: Secondary | ICD-10-CM | POA: Diagnosis not present

## 2022-12-12 DIAGNOSIS — Z191 Hormone sensitive malignancy status: Secondary | ICD-10-CM | POA: Diagnosis not present

## 2022-12-12 DIAGNOSIS — I4891 Unspecified atrial fibrillation: Secondary | ICD-10-CM | POA: Diagnosis not present

## 2022-12-12 DIAGNOSIS — Z510 Encounter for antineoplastic radiation therapy: Secondary | ICD-10-CM | POA: Diagnosis not present

## 2022-12-12 LAB — RAD ONC ARIA SESSION SUMMARY
Course Elapsed Days: 10
Plan Fractions Treated to Date: 9
Plan Prescribed Dose Per Fraction: 1.8 Gy
Plan Total Fractions Prescribed: 25
Plan Total Prescribed Dose: 45 Gy
Reference Point Dosage Given to Date: 16.2 Gy
Reference Point Session Dosage Given: 1.8 Gy
Session Number: 9

## 2022-12-12 MED ORDER — IBUPROFEN 800 MG PO TABS
800.0000 mg | ORAL_TABLET | Freq: Three times a day (TID) | ORAL | 1 refills | Status: DC | PRN
  Filled 2022-12-12: qty 30, 10d supply, fill #0
  Filled 2023-03-05: qty 30, 10d supply, fill #1

## 2022-12-12 MED ORDER — SERTRALINE HCL 100 MG PO TABS
100.0000 mg | ORAL_TABLET | Freq: Every day | ORAL | 3 refills | Status: DC
  Filled 2022-12-12: qty 30, 30d supply, fill #0
  Filled 2023-03-05: qty 30, 30d supply, fill #1
  Filled 2023-04-06: qty 30, 30d supply, fill #2
  Filled 2023-05-19: qty 30, 30d supply, fill #3
  Filled 2023-06-25: qty 30, 30d supply, fill #4
  Filled 2023-08-14: qty 30, 30d supply, fill #5
  Filled 2023-09-29 – 2023-09-30 (×3): qty 30, 30d supply, fill #6
  Filled 2023-10-14 – 2023-11-21 (×3): qty 30, 30d supply, fill #7

## 2022-12-12 MED ORDER — TRAZODONE HCL 50 MG PO TABS
50.0000 mg | ORAL_TABLET | Freq: Every evening | ORAL | 0 refills | Status: AC | PRN
  Filled 2022-12-12: qty 30, 30d supply, fill #0
  Filled 2023-06-25: qty 30, 30d supply, fill #1

## 2022-12-13 ENCOUNTER — Ambulatory Visit
Admission: RE | Admit: 2022-12-13 | Discharge: 2022-12-13 | Disposition: A | Discharge status: Home / Self Care | Payer: 59 | Source: Ambulatory Visit | Attending: Radiation Oncology | Admitting: Radiation Oncology

## 2022-12-13 ENCOUNTER — Other Ambulatory Visit: Payer: Self-pay

## 2022-12-13 DIAGNOSIS — Z510 Encounter for antineoplastic radiation therapy: Secondary | ICD-10-CM | POA: Diagnosis not present

## 2022-12-13 DIAGNOSIS — Z191 Hormone sensitive malignancy status: Secondary | ICD-10-CM | POA: Diagnosis not present

## 2022-12-13 DIAGNOSIS — C61 Malignant neoplasm of prostate: Secondary | ICD-10-CM | POA: Diagnosis not present

## 2022-12-13 LAB — RAD ONC ARIA SESSION SUMMARY
Course Elapsed Days: 11
Plan Fractions Treated to Date: 10
Plan Prescribed Dose Per Fraction: 1.8 Gy
Plan Total Fractions Prescribed: 25
Plan Total Prescribed Dose: 45 Gy
Reference Point Dosage Given to Date: 18 Gy
Reference Point Session Dosage Given: 1.8 Gy
Session Number: 10

## 2022-12-16 ENCOUNTER — Other Ambulatory Visit: Payer: Self-pay

## 2022-12-16 ENCOUNTER — Ambulatory Visit
Admission: RE | Admit: 2022-12-16 | Discharge: 2022-12-16 | Disposition: A | Discharge status: Home / Self Care | Payer: 59 | Source: Ambulatory Visit | Attending: Radiation Oncology | Admitting: Radiation Oncology

## 2022-12-16 DIAGNOSIS — Z191 Hormone sensitive malignancy status: Secondary | ICD-10-CM | POA: Diagnosis not present

## 2022-12-16 DIAGNOSIS — C61 Malignant neoplasm of prostate: Secondary | ICD-10-CM | POA: Diagnosis not present

## 2022-12-16 DIAGNOSIS — Z510 Encounter for antineoplastic radiation therapy: Secondary | ICD-10-CM | POA: Diagnosis not present

## 2022-12-16 LAB — RAD ONC ARIA SESSION SUMMARY
Course Elapsed Days: 14
Plan Fractions Treated to Date: 11
Plan Prescribed Dose Per Fraction: 1.8 Gy
Plan Total Fractions Prescribed: 25
Plan Total Prescribed Dose: 45 Gy
Reference Point Dosage Given to Date: 19.8 Gy
Reference Point Session Dosage Given: 1.8 Gy
Session Number: 11

## 2022-12-17 ENCOUNTER — Other Ambulatory Visit: Payer: Self-pay

## 2022-12-17 ENCOUNTER — Ambulatory Visit
Admission: RE | Admit: 2022-12-17 | Discharge: 2022-12-17 | Disposition: A | Discharge status: Home / Self Care | Payer: 59 | Source: Ambulatory Visit | Attending: Radiation Oncology | Admitting: Radiation Oncology

## 2022-12-17 DIAGNOSIS — Z191 Hormone sensitive malignancy status: Secondary | ICD-10-CM | POA: Diagnosis not present

## 2022-12-17 DIAGNOSIS — C61 Malignant neoplasm of prostate: Secondary | ICD-10-CM | POA: Diagnosis not present

## 2022-12-17 DIAGNOSIS — Z510 Encounter for antineoplastic radiation therapy: Secondary | ICD-10-CM | POA: Diagnosis not present

## 2022-12-17 LAB — RAD ONC ARIA SESSION SUMMARY
Course Elapsed Days: 15
Plan Fractions Treated to Date: 12
Plan Prescribed Dose Per Fraction: 1.8 Gy
Plan Total Fractions Prescribed: 25
Plan Total Prescribed Dose: 45 Gy
Reference Point Dosage Given to Date: 21.6 Gy
Reference Point Session Dosage Given: 1.8 Gy
Session Number: 12

## 2022-12-18 ENCOUNTER — Ambulatory Visit: Payer: 59

## 2022-12-19 ENCOUNTER — Other Ambulatory Visit: Payer: Self-pay

## 2022-12-19 ENCOUNTER — Ambulatory Visit
Admission: RE | Admit: 2022-12-19 | Discharge: 2022-12-19 | Disposition: A | Discharge status: Home / Self Care | Payer: 59 | Source: Ambulatory Visit | Attending: Radiation Oncology | Admitting: Radiation Oncology

## 2022-12-19 DIAGNOSIS — C61 Malignant neoplasm of prostate: Secondary | ICD-10-CM | POA: Diagnosis not present

## 2022-12-19 DIAGNOSIS — Z191 Hormone sensitive malignancy status: Secondary | ICD-10-CM | POA: Diagnosis not present

## 2022-12-19 DIAGNOSIS — Z510 Encounter for antineoplastic radiation therapy: Secondary | ICD-10-CM | POA: Diagnosis not present

## 2022-12-19 LAB — RAD ONC ARIA SESSION SUMMARY
Course Elapsed Days: 17
Plan Fractions Treated to Date: 13
Plan Prescribed Dose Per Fraction: 1.8 Gy
Plan Total Fractions Prescribed: 25
Plan Total Prescribed Dose: 45 Gy
Reference Point Dosage Given to Date: 23.4 Gy
Reference Point Session Dosage Given: 1.8 Gy
Session Number: 13

## 2022-12-20 ENCOUNTER — Ambulatory Visit
Admission: RE | Admit: 2022-12-20 | Discharge: 2022-12-20 | Disposition: A | Discharge status: Home / Self Care | Payer: 59 | Source: Ambulatory Visit | Attending: Radiation Oncology | Admitting: Radiation Oncology

## 2022-12-20 ENCOUNTER — Other Ambulatory Visit: Payer: Self-pay

## 2022-12-20 ENCOUNTER — Ambulatory Visit: Payer: 59

## 2022-12-20 DIAGNOSIS — C61 Malignant neoplasm of prostate: Secondary | ICD-10-CM | POA: Diagnosis not present

## 2022-12-20 DIAGNOSIS — Z510 Encounter for antineoplastic radiation therapy: Secondary | ICD-10-CM | POA: Diagnosis not present

## 2022-12-20 DIAGNOSIS — Z191 Hormone sensitive malignancy status: Secondary | ICD-10-CM | POA: Diagnosis not present

## 2022-12-20 LAB — RAD ONC ARIA SESSION SUMMARY
Course Elapsed Days: 18
Plan Fractions Treated to Date: 14
Plan Prescribed Dose Per Fraction: 1.8 Gy
Plan Total Fractions Prescribed: 25
Plan Total Prescribed Dose: 45 Gy
Reference Point Dosage Given to Date: 25.2 Gy
Reference Point Session Dosage Given: 1.8 Gy
Session Number: 14

## 2022-12-23 ENCOUNTER — Ambulatory Visit
Admission: RE | Admit: 2022-12-23 | Discharge: 2022-12-23 | Disposition: A | Discharge status: Home / Self Care | Payer: 59 | Source: Ambulatory Visit | Attending: Radiation Oncology | Admitting: Radiation Oncology

## 2022-12-23 ENCOUNTER — Other Ambulatory Visit: Payer: Self-pay

## 2022-12-23 DIAGNOSIS — Z510 Encounter for antineoplastic radiation therapy: Secondary | ICD-10-CM | POA: Diagnosis not present

## 2022-12-23 DIAGNOSIS — Z191 Hormone sensitive malignancy status: Secondary | ICD-10-CM | POA: Diagnosis not present

## 2022-12-23 DIAGNOSIS — C61 Malignant neoplasm of prostate: Secondary | ICD-10-CM | POA: Diagnosis not present

## 2022-12-23 LAB — RAD ONC ARIA SESSION SUMMARY
Course Elapsed Days: 21
Plan Fractions Treated to Date: 15
Plan Prescribed Dose Per Fraction: 1.8 Gy
Plan Total Fractions Prescribed: 25
Plan Total Prescribed Dose: 45 Gy
Reference Point Dosage Given to Date: 27 Gy
Reference Point Session Dosage Given: 1.8 Gy
Session Number: 15

## 2022-12-24 ENCOUNTER — Other Ambulatory Visit: Payer: Self-pay

## 2022-12-24 ENCOUNTER — Ambulatory Visit
Admission: RE | Admit: 2022-12-24 | Discharge: 2022-12-24 | Disposition: A | Discharge status: Home / Self Care | Payer: 59 | Source: Ambulatory Visit | Attending: Radiation Oncology | Admitting: Radiation Oncology

## 2022-12-24 DIAGNOSIS — Z191 Hormone sensitive malignancy status: Secondary | ICD-10-CM | POA: Diagnosis not present

## 2022-12-24 DIAGNOSIS — Z510 Encounter for antineoplastic radiation therapy: Secondary | ICD-10-CM | POA: Diagnosis not present

## 2022-12-24 DIAGNOSIS — C61 Malignant neoplasm of prostate: Secondary | ICD-10-CM | POA: Diagnosis not present

## 2022-12-24 LAB — RAD ONC ARIA SESSION SUMMARY
Course Elapsed Days: 22
Plan Fractions Treated to Date: 16
Plan Prescribed Dose Per Fraction: 1.8 Gy
Plan Total Fractions Prescribed: 25
Plan Total Prescribed Dose: 45 Gy
Reference Point Dosage Given to Date: 28.8 Gy
Reference Point Session Dosage Given: 1.8 Gy
Session Number: 16

## 2022-12-25 ENCOUNTER — Other Ambulatory Visit: Payer: Self-pay

## 2022-12-25 ENCOUNTER — Ambulatory Visit
Admission: RE | Admit: 2022-12-25 | Discharge: 2022-12-25 | Disposition: A | Discharge status: Home / Self Care | Payer: 59 | Source: Ambulatory Visit | Attending: Radiation Oncology | Admitting: Radiation Oncology

## 2022-12-25 DIAGNOSIS — Z510 Encounter for antineoplastic radiation therapy: Secondary | ICD-10-CM | POA: Diagnosis not present

## 2022-12-25 DIAGNOSIS — C61 Malignant neoplasm of prostate: Secondary | ICD-10-CM | POA: Diagnosis not present

## 2022-12-25 DIAGNOSIS — Z191 Hormone sensitive malignancy status: Secondary | ICD-10-CM | POA: Diagnosis not present

## 2022-12-25 LAB — RAD ONC ARIA SESSION SUMMARY
Course Elapsed Days: 23
Plan Fractions Treated to Date: 17
Plan Prescribed Dose Per Fraction: 1.8 Gy
Plan Total Fractions Prescribed: 25
Plan Total Prescribed Dose: 45 Gy
Reference Point Dosage Given to Date: 30.6 Gy
Reference Point Session Dosage Given: 1.8 Gy
Session Number: 17

## 2022-12-26 ENCOUNTER — Ambulatory Visit
Admission: RE | Admit: 2022-12-26 | Discharge: 2022-12-26 | Disposition: A | Discharge status: Home / Self Care | Payer: 59 | Source: Ambulatory Visit | Attending: Radiation Oncology | Admitting: Radiation Oncology

## 2022-12-26 ENCOUNTER — Other Ambulatory Visit: Payer: Self-pay

## 2022-12-26 ENCOUNTER — Other Ambulatory Visit (HOSPITAL_BASED_OUTPATIENT_CLINIC_OR_DEPARTMENT_OTHER): Payer: Self-pay

## 2022-12-26 ENCOUNTER — Other Ambulatory Visit: Payer: Self-pay | Admitting: Radiation Oncology

## 2022-12-26 DIAGNOSIS — Z191 Hormone sensitive malignancy status: Secondary | ICD-10-CM | POA: Diagnosis not present

## 2022-12-26 DIAGNOSIS — C61 Malignant neoplasm of prostate: Secondary | ICD-10-CM | POA: Diagnosis not present

## 2022-12-26 DIAGNOSIS — Z510 Encounter for antineoplastic radiation therapy: Secondary | ICD-10-CM | POA: Diagnosis not present

## 2022-12-26 DIAGNOSIS — G542 Cervical root disorders, not elsewhere classified: Secondary | ICD-10-CM

## 2022-12-26 LAB — RAD ONC ARIA SESSION SUMMARY
Course Elapsed Days: 24
Plan Fractions Treated to Date: 18
Plan Prescribed Dose Per Fraction: 1.8 Gy
Plan Total Fractions Prescribed: 25
Plan Total Prescribed Dose: 45 Gy
Reference Point Dosage Given to Date: 32.4 Gy
Reference Point Session Dosage Given: 1.8 Gy
Session Number: 18

## 2022-12-26 MED ORDER — CARISOPRODOL 350 MG PO TABS: 350.0000 mg | ORAL_TABLET | Freq: Two times a day (BID) | ORAL | 2 refills | Status: DC

## 2022-12-26 MED ORDER — CARISOPRODOL 350 MG PO TABS
350.0000 mg | ORAL_TABLET | Freq: Two times a day (BID) | ORAL | 2 refills | Status: DC
  Filled 2022-12-26: qty 60, 30d supply, fill #0
  Filled 2023-03-05: qty 60, 30d supply, fill #1
  Filled 2023-04-06: qty 60, 30d supply, fill #2

## 2022-12-27 ENCOUNTER — Ambulatory Visit
Admission: RE | Admit: 2022-12-27 | Discharge: 2022-12-27 | Disposition: A | Discharge status: Home / Self Care | Payer: 59 | Source: Ambulatory Visit | Attending: Radiation Oncology | Admitting: Radiation Oncology

## 2022-12-27 ENCOUNTER — Telehealth: Payer: Self-pay

## 2022-12-27 ENCOUNTER — Other Ambulatory Visit: Payer: Self-pay

## 2022-12-27 DIAGNOSIS — Z510 Encounter for antineoplastic radiation therapy: Secondary | ICD-10-CM | POA: Diagnosis not present

## 2022-12-27 DIAGNOSIS — Z191 Hormone sensitive malignancy status: Secondary | ICD-10-CM | POA: Diagnosis not present

## 2022-12-27 DIAGNOSIS — C61 Malignant neoplasm of prostate: Secondary | ICD-10-CM | POA: Diagnosis not present

## 2022-12-27 LAB — RAD ONC ARIA SESSION SUMMARY
Course Elapsed Days: 25
Plan Fractions Treated to Date: 19
Plan Prescribed Dose Per Fraction: 1.8 Gy
Plan Total Fractions Prescribed: 25
Plan Total Prescribed Dose: 45 Gy
Reference Point Dosage Given to Date: 34.2 Gy
Reference Point Session Dosage Given: 1.8 Gy
Session Number: 19

## 2022-12-27 NOTE — Telephone Encounter (Signed)
RN called Christian Mitchell to follow up on complaints of diarrhea and blood in his stools which he reported to radiation therapist during treatment earlier but refused to come around to see nurse.  Call was unsuccessful left voicemail to call back.

## 2022-12-30 ENCOUNTER — Other Ambulatory Visit: Payer: Self-pay

## 2022-12-30 ENCOUNTER — Ambulatory Visit
Admission: RE | Admit: 2022-12-30 | Discharge: 2022-12-30 | Disposition: A | Discharge status: Home / Self Care | Payer: 59 | Source: Ambulatory Visit | Attending: Radiation Oncology | Admitting: Radiation Oncology

## 2022-12-30 DIAGNOSIS — Z510 Encounter for antineoplastic radiation therapy: Secondary | ICD-10-CM | POA: Diagnosis not present

## 2022-12-30 DIAGNOSIS — Z191 Hormone sensitive malignancy status: Secondary | ICD-10-CM | POA: Diagnosis not present

## 2022-12-30 DIAGNOSIS — C61 Malignant neoplasm of prostate: Secondary | ICD-10-CM | POA: Diagnosis not present

## 2022-12-30 LAB — RAD ONC ARIA SESSION SUMMARY
Course Elapsed Days: 28
Plan Fractions Treated to Date: 20
Plan Prescribed Dose Per Fraction: 1.8 Gy
Plan Total Fractions Prescribed: 25
Plan Total Prescribed Dose: 45 Gy
Reference Point Dosage Given to Date: 36 Gy
Reference Point Session Dosage Given: 1.8 Gy
Session Number: 20

## 2022-12-31 ENCOUNTER — Ambulatory Visit
Admission: RE | Admit: 2022-12-31 | Discharge: 2022-12-31 | Disposition: A | Discharge status: Home / Self Care | Payer: 59 | Source: Ambulatory Visit | Attending: Radiation Oncology | Admitting: Radiation Oncology

## 2022-12-31 ENCOUNTER — Other Ambulatory Visit: Payer: Self-pay

## 2022-12-31 ENCOUNTER — Other Ambulatory Visit (HOSPITAL_BASED_OUTPATIENT_CLINIC_OR_DEPARTMENT_OTHER): Payer: Self-pay

## 2022-12-31 DIAGNOSIS — Z510 Encounter for antineoplastic radiation therapy: Secondary | ICD-10-CM | POA: Diagnosis not present

## 2022-12-31 DIAGNOSIS — Z191 Hormone sensitive malignancy status: Secondary | ICD-10-CM | POA: Diagnosis not present

## 2022-12-31 DIAGNOSIS — C61 Malignant neoplasm of prostate: Secondary | ICD-10-CM | POA: Diagnosis not present

## 2022-12-31 LAB — RAD ONC ARIA SESSION SUMMARY
Course Elapsed Days: 29
Plan Fractions Treated to Date: 21
Plan Prescribed Dose Per Fraction: 1.8 Gy
Plan Total Fractions Prescribed: 25
Plan Total Prescribed Dose: 45 Gy
Reference Point Dosage Given to Date: 37.8 Gy
Reference Point Session Dosage Given: 1.8 Gy
Session Number: 21

## 2022-12-31 MED ORDER — SERTRALINE HCL 50 MG PO TABS
50.0000 mg | ORAL_TABLET | Freq: Every day | ORAL | 0 refills | Status: DC
  Filled 2022-12-31: qty 30, 30d supply, fill #0

## 2023-01-01 ENCOUNTER — Other Ambulatory Visit: Payer: Self-pay

## 2023-01-01 ENCOUNTER — Ambulatory Visit
Admission: RE | Admit: 2023-01-01 | Discharge: 2023-01-01 | Disposition: A | Discharge status: Home / Self Care | Payer: 59 | Source: Ambulatory Visit | Attending: Radiation Oncology | Admitting: Radiation Oncology

## 2023-01-01 DIAGNOSIS — C61 Malignant neoplasm of prostate: Secondary | ICD-10-CM | POA: Diagnosis not present

## 2023-01-01 DIAGNOSIS — Z510 Encounter for antineoplastic radiation therapy: Secondary | ICD-10-CM | POA: Diagnosis not present

## 2023-01-01 DIAGNOSIS — F515 Nightmare disorder: Secondary | ICD-10-CM | POA: Diagnosis not present

## 2023-01-01 DIAGNOSIS — I4891 Unspecified atrial fibrillation: Secondary | ICD-10-CM | POA: Diagnosis not present

## 2023-01-01 DIAGNOSIS — Z191 Hormone sensitive malignancy status: Secondary | ICD-10-CM | POA: Diagnosis not present

## 2023-01-01 DIAGNOSIS — G4719 Other hypersomnia: Secondary | ICD-10-CM | POA: Diagnosis not present

## 2023-01-01 LAB — RAD ONC ARIA SESSION SUMMARY
Course Elapsed Days: 30
Plan Fractions Treated to Date: 22
Plan Prescribed Dose Per Fraction: 1.8 Gy
Plan Total Fractions Prescribed: 25
Plan Total Prescribed Dose: 45 Gy
Reference Point Dosage Given to Date: 39.6 Gy
Reference Point Session Dosage Given: 1.8 Gy
Session Number: 22

## 2023-01-02 ENCOUNTER — Ambulatory Visit
Admission: RE | Admit: 2023-01-02 | Discharge: 2023-01-02 | Disposition: A | Discharge status: Home / Self Care | Payer: 59 | Source: Ambulatory Visit | Attending: Radiation Oncology | Admitting: Radiation Oncology

## 2023-01-02 ENCOUNTER — Other Ambulatory Visit: Payer: Self-pay

## 2023-01-02 DIAGNOSIS — C61 Malignant neoplasm of prostate: Secondary | ICD-10-CM | POA: Diagnosis not present

## 2023-01-02 DIAGNOSIS — Z510 Encounter for antineoplastic radiation therapy: Secondary | ICD-10-CM | POA: Diagnosis not present

## 2023-01-02 DIAGNOSIS — Z191 Hormone sensitive malignancy status: Secondary | ICD-10-CM | POA: Diagnosis not present

## 2023-01-02 LAB — RAD ONC ARIA SESSION SUMMARY
Course Elapsed Days: 31
Plan Fractions Treated to Date: 23
Plan Prescribed Dose Per Fraction: 1.8 Gy
Plan Total Fractions Prescribed: 25
Plan Total Prescribed Dose: 45 Gy
Reference Point Dosage Given to Date: 41.4 Gy
Reference Point Session Dosage Given: 1.8 Gy
Session Number: 23

## 2023-01-03 ENCOUNTER — Other Ambulatory Visit: Payer: Self-pay

## 2023-01-03 ENCOUNTER — Ambulatory Visit: Admission: RE | Admit: 2023-01-03 | Payer: 59 | Source: Ambulatory Visit

## 2023-01-03 ENCOUNTER — Ambulatory Visit
Admission: RE | Admit: 2023-01-03 | Discharge: 2023-01-03 | Disposition: A | Discharge status: Home / Self Care | Payer: 59 | Source: Ambulatory Visit | Attending: Radiation Oncology | Admitting: Radiation Oncology

## 2023-01-03 DIAGNOSIS — Z191 Hormone sensitive malignancy status: Secondary | ICD-10-CM | POA: Diagnosis not present

## 2023-01-03 DIAGNOSIS — Z510 Encounter for antineoplastic radiation therapy: Secondary | ICD-10-CM | POA: Diagnosis not present

## 2023-01-03 DIAGNOSIS — C61 Malignant neoplasm of prostate: Secondary | ICD-10-CM | POA: Diagnosis not present

## 2023-01-03 LAB — RAD ONC ARIA SESSION SUMMARY
Course Elapsed Days: 32
Plan Fractions Treated to Date: 24
Plan Prescribed Dose Per Fraction: 1.8 Gy
Plan Total Fractions Prescribed: 25
Plan Total Prescribed Dose: 45 Gy
Reference Point Dosage Given to Date: 43.2 Gy
Reference Point Session Dosage Given: 1.8 Gy
Session Number: 24

## 2023-01-06 ENCOUNTER — Other Ambulatory Visit (HOSPITAL_COMMUNITY): Payer: Self-pay

## 2023-01-06 ENCOUNTER — Ambulatory Visit: Payer: 59

## 2023-01-06 ENCOUNTER — Telehealth: Payer: Self-pay

## 2023-01-06 ENCOUNTER — Other Ambulatory Visit: Payer: Self-pay

## 2023-01-06 DIAGNOSIS — C61 Malignant neoplasm of prostate: Secondary | ICD-10-CM | POA: Diagnosis not present

## 2023-01-06 DIAGNOSIS — Z191 Hormone sensitive malignancy status: Secondary | ICD-10-CM | POA: Diagnosis not present

## 2023-01-06 DIAGNOSIS — Z510 Encounter for antineoplastic radiation therapy: Secondary | ICD-10-CM | POA: Diagnosis not present

## 2023-01-06 LAB — RAD ONC ARIA SESSION SUMMARY
Course Elapsed Days: 35
Plan Fractions Treated to Date: 25
Plan Prescribed Dose Per Fraction: 1.8 Gy
Plan Total Fractions Prescribed: 25
Plan Total Prescribed Dose: 45 Gy
Reference Point Dosage Given to Date: 45 Gy
Reference Point Session Dosage Given: 1.8 Gy
Session Number: 25

## 2023-01-06 NOTE — Telephone Encounter (Signed)
[facility] Patient Teacher, English as a foreign language completed.    The patient is insured through [employer] .  Medication will need a PA .  Insurance will pay for [place] or [place] .  We will continue to follow to see if copay assistance is needed.  Ileene Patrick, [facility] Patient [facility] for Infectious Disease Phone: [phone] Fax:  [phone]

## 2023-01-07 ENCOUNTER — Other Ambulatory Visit: Payer: Self-pay

## 2023-01-07 ENCOUNTER — Encounter: Payer: 59 | Admitting: Internal Medicine

## 2023-01-07 ENCOUNTER — Ambulatory Visit: Payer: 59

## 2023-01-07 DIAGNOSIS — C61 Malignant neoplasm of prostate: Secondary | ICD-10-CM | POA: Diagnosis not present

## 2023-01-07 DIAGNOSIS — Z510 Encounter for antineoplastic radiation therapy: Secondary | ICD-10-CM | POA: Diagnosis not present

## 2023-01-07 DIAGNOSIS — Z191 Hormone sensitive malignancy status: Secondary | ICD-10-CM | POA: Diagnosis not present

## 2023-01-07 LAB — RAD ONC ARIA SESSION SUMMARY
Course Elapsed Days: 36
Plan Fractions Treated to Date: 1
Plan Prescribed Dose Per Fraction: 2 Gy
Plan Total Fractions Prescribed: 15
Plan Total Prescribed Dose: 30 Gy
Reference Point Dosage Given to Date: 2 Gy
Reference Point Session Dosage Given: 2 Gy
Session Number: 26
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
## 2023-01-08 ENCOUNTER — Other Ambulatory Visit (HOSPITAL_BASED_OUTPATIENT_CLINIC_OR_DEPARTMENT_OTHER): Payer: Self-pay

## 2023-01-08 ENCOUNTER — Encounter: Payer: 59 | Admitting: Family

## 2023-01-08 ENCOUNTER — Ambulatory Visit: Payer: 59

## 2023-01-08 DIAGNOSIS — R0902 Hypoxemia: Secondary | ICD-10-CM | POA: Diagnosis not present

## 2023-01-08 DIAGNOSIS — R1084 Generalized abdominal pain: Secondary | ICD-10-CM | POA: Diagnosis not present

## 2023-01-08 DIAGNOSIS — Z79899 Other long term (current) drug therapy: Secondary | ICD-10-CM | POA: Diagnosis not present

## 2023-01-08 DIAGNOSIS — N35911 Unspecified urethral stricture, male, meatal: Secondary | ICD-10-CM | POA: Diagnosis not present

## 2023-01-08 DIAGNOSIS — N35811 Other urethral stricture, male, meatal: Secondary | ICD-10-CM | POA: Diagnosis not present

## 2023-01-08 DIAGNOSIS — Z743 Need for continuous supervision: Secondary | ICD-10-CM | POA: Diagnosis not present

## 2023-01-08 DIAGNOSIS — Z85820 Personal history of malignant melanoma of skin: Secondary | ICD-10-CM | POA: Diagnosis not present

## 2023-01-08 DIAGNOSIS — N281 Cyst of kidney, acquired: Secondary | ICD-10-CM | POA: Diagnosis not present

## 2023-01-08 DIAGNOSIS — R1031 Right lower quadrant pain: Secondary | ICD-10-CM | POA: Diagnosis not present

## 2023-01-08 DIAGNOSIS — G8929 Other chronic pain: Secondary | ICD-10-CM | POA: Diagnosis not present

## 2023-01-08 DIAGNOSIS — I10 Essential (primary) hypertension: Secondary | ICD-10-CM | POA: Diagnosis not present

## 2023-01-08 DIAGNOSIS — D61818 Other pancytopenia: Secondary | ICD-10-CM | POA: Diagnosis not present

## 2023-01-08 DIAGNOSIS — N201 Calculus of ureter: Secondary | ICD-10-CM | POA: Diagnosis not present

## 2023-01-08 DIAGNOSIS — K219 Gastro-esophageal reflux disease without esophagitis: Secondary | ICD-10-CM | POA: Diagnosis not present

## 2023-01-08 DIAGNOSIS — I480 Paroxysmal atrial fibrillation: Secondary | ICD-10-CM | POA: Diagnosis not present

## 2023-01-08 DIAGNOSIS — C61 Malignant neoplasm of prostate: Secondary | ICD-10-CM | POA: Diagnosis not present

## 2023-01-08 DIAGNOSIS — J4540 Moderate persistent asthma, uncomplicated: Secondary | ICD-10-CM | POA: Diagnosis not present

## 2023-01-08 DIAGNOSIS — R911 Solitary pulmonary nodule: Secondary | ICD-10-CM | POA: Diagnosis not present

## 2023-01-08 DIAGNOSIS — F1010 Alcohol abuse, uncomplicated: Secondary | ICD-10-CM | POA: Diagnosis not present

## 2023-01-08 DIAGNOSIS — E876 Hypokalemia: Secondary | ICD-10-CM | POA: Diagnosis not present

## 2023-01-08 DIAGNOSIS — R339 Retention of urine, unspecified: Secondary | ICD-10-CM | POA: Diagnosis not present

## 2023-01-08 DIAGNOSIS — J449 Chronic obstructive pulmonary disease, unspecified: Secondary | ICD-10-CM | POA: Diagnosis not present

## 2023-01-08 DIAGNOSIS — Z8042 Family history of malignant neoplasm of prostate: Secondary | ICD-10-CM | POA: Diagnosis not present

## 2023-01-08 DIAGNOSIS — Z87891 Personal history of nicotine dependence: Secondary | ICD-10-CM | POA: Diagnosis not present

## 2023-01-08 DIAGNOSIS — D649 Anemia, unspecified: Secondary | ICD-10-CM | POA: Diagnosis not present

## 2023-01-08 DIAGNOSIS — R000 Tachycardia, unspecified: Secondary | ICD-10-CM | POA: Diagnosis not present

## 2023-01-08 DIAGNOSIS — F1090 Alcohol use, unspecified, uncomplicated: Secondary | ICD-10-CM | POA: Diagnosis not present

## 2023-01-08 DIAGNOSIS — R109 Unspecified abdominal pain: Secondary | ICD-10-CM | POA: Diagnosis not present

## 2023-01-08 DIAGNOSIS — N132 Hydronephrosis with renal and ureteral calculous obstruction: Secondary | ICD-10-CM | POA: Diagnosis not present

## 2023-01-08 DIAGNOSIS — N200 Calculus of kidney: Secondary | ICD-10-CM | POA: Diagnosis not present

## 2023-01-08 MED ORDER — OXYBUTYNIN CHLORIDE ER 5 MG PO TB24
5.0000 mg | ORAL_TABLET | Freq: Every day | ORAL | 0 refills | Status: DC
  Filled 2023-01-08: qty 10, 10d supply, fill #0

## 2023-01-08 MED ORDER — KETOROLAC TROMETHAMINE 10 MG PO TABS
10.0000 mg | ORAL_TABLET | Freq: Four times a day (QID) | ORAL | 0 refills | Status: AC | PRN
  Filled 2023-01-08: qty 20, 5d supply, fill #0

## 2023-01-09 ENCOUNTER — Other Ambulatory Visit (HOSPITAL_BASED_OUTPATIENT_CLINIC_OR_DEPARTMENT_OTHER): Payer: Self-pay

## 2023-01-09 ENCOUNTER — Ambulatory Visit: Payer: 59

## 2023-01-10 ENCOUNTER — Telehealth: Payer: Self-pay

## 2023-01-10 ENCOUNTER — Other Ambulatory Visit: Payer: Self-pay

## 2023-01-10 ENCOUNTER — Ambulatory Visit: Payer: 59

## 2023-01-10 ENCOUNTER — Other Ambulatory Visit (HOSPITAL_BASED_OUTPATIENT_CLINIC_OR_DEPARTMENT_OTHER): Payer: Self-pay

## 2023-01-10 MED ORDER — PHENAZOPYRIDINE HCL 200 MG PO TABS
ORAL_TABLET | ORAL | 0 refills | Status: AC
  Filled 2023-01-10 – 2023-06-25 (×2): qty 6, 2d supply, fill #0

## 2023-01-10 MED ORDER — LOSARTAN POTASSIUM 50 MG PO TABS
50.0000 mg | ORAL_TABLET | Freq: Every day | ORAL | 3 refills | Status: AC
  Filled 2023-01-10 (×2): qty 30, 30d supply, fill #0
  Filled 2023-02-07: qty 30, 30d supply, fill #1
  Filled 2023-03-05: qty 30, 30d supply, fill #2
  Filled 2023-04-06: qty 30, 30d supply, fill #3
  Filled 2023-05-19: qty 30, 30d supply, fill #4
  Filled 2023-06-25: qty 30, 30d supply, fill #5
  Filled 2023-08-14: qty 30, 30d supply, fill #6
  Filled 2023-09-29 – 2023-09-30 (×3): qty 30, 30d supply, fill #7
  Filled 2023-10-14 – 2023-11-21 (×3): qty 30, 30d supply, fill #8
  Filled 2023-12-20: qty 30, 30d supply, fill #9

## 2023-01-10 MED ORDER — CEFDINIR 300 MG PO CAPS
300.0000 mg | ORAL_CAPSULE | Freq: Every day | ORAL | 0 refills | Status: AC
  Filled 2023-01-10: qty 3, 3d supply, fill #0

## 2023-01-10 NOTE — Telephone Encounter (Signed)
RN called Christian Mitchell he missed a few appointments just checking to see if he was alright.  He stated tried to call to inform staff about he current situation.  Christian Mitchell reports he's in the hospital [name] since 01/08/2023 for kidney stones.  He had surgery and is to be discharged today if possible and that he plans to come in on Monday 01/13/2023 for treatment.  Radiation therapist were informed of this and cancel today's appointment.

## 2023-01-13 ENCOUNTER — Ambulatory Visit
Admission: RE | Admit: 2023-01-13 | Discharge: 2023-01-13 | Disposition: A | Discharge status: Home / Self Care | Payer: 59 | Source: Ambulatory Visit | Attending: Radiation Oncology | Admitting: Radiation Oncology

## 2023-01-13 ENCOUNTER — Other Ambulatory Visit (HOSPITAL_BASED_OUTPATIENT_CLINIC_OR_DEPARTMENT_OTHER): Payer: Self-pay

## 2023-01-13 ENCOUNTER — Other Ambulatory Visit: Payer: Self-pay

## 2023-01-13 DIAGNOSIS — Z191 Hormone sensitive malignancy status: Secondary | ICD-10-CM | POA: Diagnosis not present

## 2023-01-13 DIAGNOSIS — C61 Malignant neoplasm of prostate: Secondary | ICD-10-CM | POA: Diagnosis not present

## 2023-01-13 DIAGNOSIS — Z510 Encounter for antineoplastic radiation therapy: Secondary | ICD-10-CM | POA: Diagnosis not present

## 2023-01-13 LAB — RAD ONC ARIA SESSION SUMMARY
Course Elapsed Days: 42
Plan Fractions Treated to Date: 2
Plan Prescribed Dose Per Fraction: 2 Gy
Plan Total Fractions Prescribed: 15
Plan Total Prescribed Dose: 30 Gy
Reference Point Dosage Given to Date: 4 Gy
Reference Point Session Dosage Given: 2 Gy
Session Number: 27

## 2023-01-14 ENCOUNTER — Ambulatory Visit
Admission: RE | Admit: 2023-01-14 | Discharge: 2023-01-14 | Disposition: A | Discharge status: Home / Self Care | Payer: 59 | Source: Ambulatory Visit | Attending: Radiation Oncology | Admitting: Radiation Oncology

## 2023-01-14 ENCOUNTER — Other Ambulatory Visit: Payer: Self-pay

## 2023-01-14 DIAGNOSIS — Z191 Hormone sensitive malignancy status: Secondary | ICD-10-CM | POA: Diagnosis not present

## 2023-01-14 DIAGNOSIS — C61 Malignant neoplasm of prostate: Secondary | ICD-10-CM | POA: Diagnosis not present

## 2023-01-14 DIAGNOSIS — Z510 Encounter for antineoplastic radiation therapy: Secondary | ICD-10-CM | POA: Diagnosis not present

## 2023-01-14 LAB — RAD ONC ARIA SESSION SUMMARY
Course Elapsed Days: 43
Plan Fractions Treated to Date: 3
Plan Prescribed Dose Per Fraction: 2 Gy
Plan Total Fractions Prescribed: 15
Plan Total Prescribed Dose: 30 Gy
Reference Point Dosage Given to Date: 6 Gy
Reference Point Session Dosage Given: 2 Gy
Session Number: 28

## 2023-01-15 ENCOUNTER — Other Ambulatory Visit: Payer: Self-pay

## 2023-01-15 ENCOUNTER — Ambulatory Visit
Admission: RE | Admit: 2023-01-15 | Discharge: 2023-01-15 | Disposition: A | Discharge status: Home / Self Care | Payer: 59 | Source: Ambulatory Visit | Attending: Radiation Oncology | Admitting: Radiation Oncology

## 2023-01-15 DIAGNOSIS — Z510 Encounter for antineoplastic radiation therapy: Secondary | ICD-10-CM | POA: Diagnosis not present

## 2023-01-15 DIAGNOSIS — Z191 Hormone sensitive malignancy status: Secondary | ICD-10-CM | POA: Diagnosis not present

## 2023-01-15 DIAGNOSIS — C61 Malignant neoplasm of prostate: Secondary | ICD-10-CM | POA: Diagnosis not present

## 2023-01-15 DIAGNOSIS — Z09 Encounter for follow-up examination after completed treatment for conditions other than malignant neoplasm: Secondary | ICD-10-CM | POA: Diagnosis not present

## 2023-01-15 LAB — RAD ONC ARIA SESSION SUMMARY
Course Elapsed Days: 44
Plan Fractions Treated to Date: 4
Plan Prescribed Dose Per Fraction: 2 Gy
Plan Total Fractions Prescribed: 15
Plan Total Prescribed Dose: 30 Gy
Reference Point Dosage Given to Date: 8 Gy
Reference Point Session Dosage Given: 2 Gy
Session Number: 29

## 2023-01-16 ENCOUNTER — Ambulatory Visit: Payer: 59

## 2023-01-17 ENCOUNTER — Other Ambulatory Visit (HOSPITAL_BASED_OUTPATIENT_CLINIC_OR_DEPARTMENT_OTHER): Payer: Self-pay

## 2023-01-17 ENCOUNTER — Ambulatory Visit
Admission: RE | Admit: 2023-01-17 | Discharge: 2023-01-17 | Disposition: A | Discharge status: Home / Self Care | Payer: 59 | Source: Ambulatory Visit | Attending: Radiation Oncology | Admitting: Radiation Oncology

## 2023-01-17 ENCOUNTER — Other Ambulatory Visit: Payer: Self-pay | Admitting: Radiation Oncology

## 2023-01-17 ENCOUNTER — Other Ambulatory Visit: Payer: Self-pay | Admitting: Urology

## 2023-01-17 ENCOUNTER — Telehealth: Payer: Self-pay

## 2023-01-17 ENCOUNTER — Other Ambulatory Visit: Payer: Self-pay

## 2023-01-17 ENCOUNTER — Ambulatory Visit: Payer: 59

## 2023-01-17 DIAGNOSIS — C61 Malignant neoplasm of prostate: Secondary | ICD-10-CM

## 2023-01-17 DIAGNOSIS — Z510 Encounter for antineoplastic radiation therapy: Secondary | ICD-10-CM | POA: Diagnosis not present

## 2023-01-17 DIAGNOSIS — Z191 Hormone sensitive malignancy status: Secondary | ICD-10-CM | POA: Diagnosis not present

## 2023-01-17 LAB — RAD ONC ARIA SESSION SUMMARY
Course Elapsed Days: 46
Plan Fractions Treated to Date: 5
Plan Prescribed Dose Per Fraction: 2 Gy
Plan Total Fractions Prescribed: 15
Plan Total Prescribed Dose: 30 Gy
Reference Point Dosage Given to Date: 10 Gy
Reference Point Session Dosage Given: 2 Gy
Session Number: 30

## 2023-01-17 LAB — URINALYSIS, COMPLETE (UACMP) WITH MICROSCOPIC
Bacteria, UA: NONE SEEN
Bilirubin Urine: NEGATIVE
Glucose, UA: NEGATIVE mg/dL
Ketones, ur: NEGATIVE mg/dL
Leukocytes,Ua: NEGATIVE
Nitrite: NEGATIVE
Protein, ur: >=300 mg/dL — AB
RBC / HPF: >50 RBC/hpf (ref 0–5)
Specific Gravity, Urine: 1.017 (ref 1.005–1.030)
pH: 5 (ref 5.0–8.0)

## 2023-01-17 MED ORDER — CIPROFLOXACIN HCL 500 MG PO TABS
500.0000 mg | ORAL_TABLET | Freq: Two times a day (BID) | ORAL | 0 refills | Status: AC
  Filled 2023-01-17: qty 28, 14d supply, fill #0

## 2023-01-17 MED ORDER — OXYCODONE HCL 5 MG PO TABS
5.0000 mg | ORAL_TABLET | ORAL | 0 refills | Status: AC | PRN
  Filled 2023-01-17: qty 30, 5d supply, fill #0

## 2023-01-17 NOTE — Telephone Encounter (Signed)
RN called Christian Mitchell to inform him that prescription for antibiotic and pain medication was called into his pharmacy.  Advised Christian Mitchell to start medication right away and to continue drinking water.  RN informed him that we are waiting for results from his urine culture and that it may be possible that his antibiotic medication will change.  Christian Mitchell was appreciative of the call and said that he has picked up medication and has started first dose.  Nothing else follows.

## 2023-01-19 LAB — URINE CULTURE: Culture: NO GROWTH

## 2023-01-20 ENCOUNTER — Ambulatory Visit: Payer: 59

## 2023-01-21 ENCOUNTER — Ambulatory Visit: Payer: 59

## 2023-01-21 DIAGNOSIS — C61 Malignant neoplasm of prostate: Secondary | ICD-10-CM | POA: Insufficient documentation

## 2023-01-22 ENCOUNTER — Other Ambulatory Visit: Payer: Self-pay

## 2023-01-22 ENCOUNTER — Ambulatory Visit
Admission: RE | Admit: 2023-01-22 | Discharge: 2023-01-22 | Disposition: A | Discharge status: Home / Self Care | Payer: 59 | Source: Ambulatory Visit | Attending: Radiation Oncology | Admitting: Radiation Oncology

## 2023-01-22 DIAGNOSIS — Z510 Encounter for antineoplastic radiation therapy: Secondary | ICD-10-CM | POA: Diagnosis not present

## 2023-01-22 DIAGNOSIS — C61 Malignant neoplasm of prostate: Secondary | ICD-10-CM | POA: Diagnosis not present

## 2023-01-22 DIAGNOSIS — Z191 Hormone sensitive malignancy status: Secondary | ICD-10-CM | POA: Diagnosis not present

## 2023-01-22 LAB — RAD ONC ARIA SESSION SUMMARY
Course Elapsed Days: 51
Plan Fractions Treated to Date: 6
Plan Prescribed Dose Per Fraction: 2 Gy
Plan Total Fractions Prescribed: 15
Plan Total Prescribed Dose: 30 Gy
Reference Point Dosage Given to Date: 12 Gy
Reference Point Session Dosage Given: 2 Gy
Session Number: 31

## 2023-01-23 ENCOUNTER — Ambulatory Visit: Payer: 59

## 2023-01-24 ENCOUNTER — Other Ambulatory Visit: Payer: Self-pay

## 2023-01-24 ENCOUNTER — Ambulatory Visit
Admission: RE | Admit: 2023-01-24 | Discharge: 2023-01-24 | Disposition: A | Discharge status: Home / Self Care | Payer: 59 | Source: Ambulatory Visit | Attending: Radiation Oncology | Admitting: Radiation Oncology

## 2023-01-24 ENCOUNTER — Ambulatory Visit: Payer: 59

## 2023-01-24 DIAGNOSIS — Z191 Hormone sensitive malignancy status: Secondary | ICD-10-CM | POA: Diagnosis not present

## 2023-01-24 DIAGNOSIS — Z510 Encounter for antineoplastic radiation therapy: Secondary | ICD-10-CM | POA: Diagnosis not present

## 2023-01-24 DIAGNOSIS — C61 Malignant neoplasm of prostate: Secondary | ICD-10-CM | POA: Diagnosis not present

## 2023-01-24 LAB — RAD ONC ARIA SESSION SUMMARY
Course Elapsed Days: 53
Plan Fractions Treated to Date: 7
Plan Prescribed Dose Per Fraction: 2 Gy
Plan Total Fractions Prescribed: 15
Plan Total Prescribed Dose: 30 Gy
Reference Point Dosage Given to Date: 14 Gy
Reference Point Session Dosage Given: 2 Gy
Session Number: 32

## 2023-01-27 ENCOUNTER — Other Ambulatory Visit: Payer: Self-pay | Admitting: Urology

## 2023-01-27 ENCOUNTER — Other Ambulatory Visit (HOSPITAL_BASED_OUTPATIENT_CLINIC_OR_DEPARTMENT_OTHER): Payer: Self-pay

## 2023-01-27 ENCOUNTER — Ambulatory Visit: Payer: 59

## 2023-01-27 ENCOUNTER — Ambulatory Visit
Admission: RE | Admit: 2023-01-27 | Discharge: 2023-01-27 | Disposition: A | Discharge status: Home / Self Care | Payer: 59 | Source: Ambulatory Visit | Attending: Radiation Oncology | Admitting: Radiation Oncology

## 2023-01-27 ENCOUNTER — Other Ambulatory Visit: Payer: Self-pay

## 2023-01-27 DIAGNOSIS — Z191 Hormone sensitive malignancy status: Secondary | ICD-10-CM | POA: Diagnosis not present

## 2023-01-27 DIAGNOSIS — C61 Malignant neoplasm of prostate: Secondary | ICD-10-CM | POA: Diagnosis not present

## 2023-01-27 DIAGNOSIS — Z510 Encounter for antineoplastic radiation therapy: Secondary | ICD-10-CM | POA: Diagnosis not present

## 2023-01-27 LAB — RAD ONC ARIA SESSION SUMMARY
Course Elapsed Days: 56
Plan Fractions Treated to Date: 8
Plan Prescribed Dose Per Fraction: 2 Gy
Plan Total Fractions Prescribed: 15
Plan Total Prescribed Dose: 30 Gy
Reference Point Dosage Given to Date: 16 Gy
Reference Point Session Dosage Given: 2 Gy
Session Number: 33

## 2023-01-27 NOTE — Telephone Encounter (Signed)
Urine culture negative. Pain is likely related to his recent ureteroscopy procedure for stone extraction and stent placement with Dr. Sabino Gasser at [facility] in [facility] so he should contact the urology office for further management of stone related symptoms.

## 2023-01-28 ENCOUNTER — Telehealth: Payer: Self-pay

## 2023-01-28 ENCOUNTER — Other Ambulatory Visit: Payer: Self-pay

## 2023-01-28 ENCOUNTER — Ambulatory Visit: Payer: 59

## 2023-01-28 ENCOUNTER — Other Ambulatory Visit (HOSPITAL_BASED_OUTPATIENT_CLINIC_OR_DEPARTMENT_OTHER): Payer: Self-pay

## 2023-01-28 ENCOUNTER — Ambulatory Visit
Admission: RE | Admit: 2023-01-28 | Discharge: 2023-01-28 | Disposition: A | Discharge status: Home / Self Care | Payer: 59 | Source: Ambulatory Visit | Attending: Radiation Oncology | Admitting: Radiation Oncology

## 2023-01-28 DIAGNOSIS — C61 Malignant neoplasm of prostate: Secondary | ICD-10-CM | POA: Diagnosis not present

## 2023-01-28 DIAGNOSIS — Z191 Hormone sensitive malignancy status: Secondary | ICD-10-CM | POA: Diagnosis not present

## 2023-01-28 DIAGNOSIS — Z510 Encounter for antineoplastic radiation therapy: Secondary | ICD-10-CM | POA: Diagnosis not present

## 2023-01-28 LAB — RAD ONC ARIA SESSION SUMMARY
Course Elapsed Days: 57
Plan Fractions Treated to Date: 9
Plan Prescribed Dose Per Fraction: 2 Gy
Plan Total Fractions Prescribed: 15
Plan Total Prescribed Dose: 30 Gy
Reference Point Dosage Given to Date: 18 Gy
Reference Point Session Dosage Given: 2 Gy
Session Number: 34

## 2023-01-28 NOTE — Telephone Encounter (Signed)
RN called [name] per Marcello Fennel, PA-C to inform him of urine culture results.  Given information about and future issues with pain for him to contact his urologist since it's most likely related to his ureteral stone procedure.  Mr. Christian Mitchell recently had procedure to remove kidney stone on 01/15/2023 in which he had ureter stent removed.  [name] stated still having some discomfort but, will likely use OTC pain medications and not narcotic in the future.  He verbalized understanding and call we ended call.

## 2023-01-29 ENCOUNTER — Other Ambulatory Visit: Payer: Self-pay

## 2023-01-29 ENCOUNTER — Ambulatory Visit: Payer: 59

## 2023-01-29 ENCOUNTER — Ambulatory Visit
Admission: RE | Admit: 2023-01-29 | Discharge: 2023-01-29 | Disposition: A | Discharge status: Home / Self Care | Payer: 59 | Source: Ambulatory Visit | Attending: Radiation Oncology | Admitting: Radiation Oncology

## 2023-01-29 DIAGNOSIS — Z510 Encounter for antineoplastic radiation therapy: Secondary | ICD-10-CM | POA: Diagnosis not present

## 2023-01-29 DIAGNOSIS — C61 Malignant neoplasm of prostate: Secondary | ICD-10-CM | POA: Diagnosis not present

## 2023-01-29 DIAGNOSIS — Z191 Hormone sensitive malignancy status: Secondary | ICD-10-CM | POA: Diagnosis not present

## 2023-01-29 LAB — RAD ONC ARIA SESSION SUMMARY
Course Elapsed Days: 58
Plan Fractions Treated to Date: 10
Plan Prescribed Dose Per Fraction: 2 Gy
Plan Total Fractions Prescribed: 15
Plan Total Prescribed Dose: 30 Gy
Reference Point Dosage Given to Date: 20 Gy
Reference Point Session Dosage Given: 2 Gy
Session Number: 35

## 2023-01-30 ENCOUNTER — Other Ambulatory Visit: Payer: Self-pay

## 2023-01-30 ENCOUNTER — Ambulatory Visit
Admission: RE | Admit: 2023-01-30 | Discharge: 2023-01-30 | Disposition: A | Discharge status: Home / Self Care | Payer: 59 | Source: Ambulatory Visit | Attending: Radiation Oncology | Admitting: Radiation Oncology

## 2023-01-30 ENCOUNTER — Ambulatory Visit: Payer: 59

## 2023-01-30 DIAGNOSIS — Z191 Hormone sensitive malignancy status: Secondary | ICD-10-CM | POA: Diagnosis not present

## 2023-01-30 DIAGNOSIS — Z510 Encounter for antineoplastic radiation therapy: Secondary | ICD-10-CM | POA: Diagnosis not present

## 2023-01-30 DIAGNOSIS — C61 Malignant neoplasm of prostate: Secondary | ICD-10-CM | POA: Diagnosis not present

## 2023-01-30 LAB — RAD ONC ARIA SESSION SUMMARY
Course Elapsed Days: 59
Plan Fractions Treated to Date: 11
Plan Prescribed Dose Per Fraction: 2 Gy
Plan Total Fractions Prescribed: 15
Plan Total Prescribed Dose: 30 Gy
Reference Point Dosage Given to Date: 22 Gy
Reference Point Session Dosage Given: 2 Gy
Session Number: 36

## 2023-01-31 ENCOUNTER — Ambulatory Visit: Payer: 59

## 2023-01-31 ENCOUNTER — Other Ambulatory Visit: Payer: Self-pay

## 2023-01-31 ENCOUNTER — Ambulatory Visit
Admission: RE | Admit: 2023-01-31 | Discharge: 2023-01-31 | Disposition: A | Discharge status: Home / Self Care | Payer: 59 | Source: Ambulatory Visit | Attending: Radiation Oncology | Admitting: Radiation Oncology

## 2023-01-31 DIAGNOSIS — Z510 Encounter for antineoplastic radiation therapy: Secondary | ICD-10-CM | POA: Diagnosis not present

## 2023-01-31 DIAGNOSIS — C61 Malignant neoplasm of prostate: Secondary | ICD-10-CM | POA: Diagnosis not present

## 2023-01-31 DIAGNOSIS — Z191 Hormone sensitive malignancy status: Secondary | ICD-10-CM | POA: Diagnosis not present

## 2023-01-31 LAB — RAD ONC ARIA SESSION SUMMARY
Course Elapsed Days: 60
Plan Fractions Treated to Date: 12
Plan Prescribed Dose Per Fraction: 2 Gy
Plan Total Fractions Prescribed: 15
Plan Total Prescribed Dose: 30 Gy
Reference Point Dosage Given to Date: 24 Gy
Reference Point Session Dosage Given: 2 Gy
Session Number: 37

## 2023-02-03 ENCOUNTER — Other Ambulatory Visit: Payer: Self-pay

## 2023-02-03 ENCOUNTER — Ambulatory Visit: Payer: 59

## 2023-02-03 ENCOUNTER — Ambulatory Visit
Admission: RE | Admit: 2023-02-03 | Discharge: 2023-02-03 | Disposition: A | Discharge status: Home / Self Care | Payer: 59 | Source: Ambulatory Visit | Attending: Radiation Oncology | Admitting: Radiation Oncology

## 2023-02-03 DIAGNOSIS — Z191 Hormone sensitive malignancy status: Secondary | ICD-10-CM | POA: Diagnosis not present

## 2023-02-03 DIAGNOSIS — Z510 Encounter for antineoplastic radiation therapy: Secondary | ICD-10-CM | POA: Diagnosis not present

## 2023-02-03 DIAGNOSIS — C61 Malignant neoplasm of prostate: Secondary | ICD-10-CM | POA: Diagnosis not present

## 2023-02-03 LAB — RAD ONC ARIA SESSION SUMMARY
Course Elapsed Days: 63
Plan Fractions Treated to Date: 13
Plan Prescribed Dose Per Fraction: 2 Gy
Plan Total Fractions Prescribed: 15
Plan Total Prescribed Dose: 30 Gy
Reference Point Dosage Given to Date: 26 Gy
Reference Point Session Dosage Given: 2 Gy
Session Number: 38

## 2023-02-04 ENCOUNTER — Ambulatory Visit
Admission: RE | Admit: 2023-02-04 | Discharge: 2023-02-04 | Disposition: A | Discharge status: Home / Self Care | Payer: 59 | Source: Ambulatory Visit | Attending: Radiation Oncology | Admitting: Radiation Oncology

## 2023-02-04 ENCOUNTER — Other Ambulatory Visit: Payer: Self-pay

## 2023-02-04 DIAGNOSIS — Z510 Encounter for antineoplastic radiation therapy: Secondary | ICD-10-CM | POA: Diagnosis not present

## 2023-02-04 DIAGNOSIS — C61 Malignant neoplasm of prostate: Secondary | ICD-10-CM | POA: Diagnosis not present

## 2023-02-04 DIAGNOSIS — Z191 Hormone sensitive malignancy status: Secondary | ICD-10-CM | POA: Diagnosis not present

## 2023-02-04 LAB — RAD ONC ARIA SESSION SUMMARY
Course Elapsed Days: 64
Plan Fractions Treated to Date: 14
Plan Prescribed Dose Per Fraction: 2 Gy
Plan Total Fractions Prescribed: 15
Plan Total Prescribed Dose: 30 Gy
Reference Point Dosage Given to Date: 28 Gy
Reference Point Session Dosage Given: 2 Gy
Session Number: 39

## 2023-02-05 ENCOUNTER — Ambulatory Visit
Admission: RE | Admit: 2023-02-05 | Discharge: 2023-02-05 | Disposition: A | Discharge status: Home / Self Care | Payer: 59 | Source: Ambulatory Visit | Attending: Radiation Oncology | Admitting: Radiation Oncology

## 2023-02-05 ENCOUNTER — Other Ambulatory Visit: Payer: Self-pay

## 2023-02-05 ENCOUNTER — Encounter: Payer: Self-pay | Admitting: Urology

## 2023-02-05 DIAGNOSIS — C61 Malignant neoplasm of prostate: Secondary | ICD-10-CM | POA: Diagnosis not present

## 2023-02-05 DIAGNOSIS — Z191 Hormone sensitive malignancy status: Secondary | ICD-10-CM | POA: Diagnosis not present

## 2023-02-05 DIAGNOSIS — G4733 Obstructive sleep apnea (adult) (pediatric): Secondary | ICD-10-CM | POA: Diagnosis not present

## 2023-02-05 DIAGNOSIS — Z510 Encounter for antineoplastic radiation therapy: Secondary | ICD-10-CM | POA: Diagnosis not present

## 2023-02-05 LAB — RAD ONC ARIA SESSION SUMMARY
Course Elapsed Days: 65
Plan Fractions Treated to Date: 15
Plan Prescribed Dose Per Fraction: 2 Gy
Plan Total Fractions Prescribed: 15
Plan Total Prescribed Dose: 30 Gy
Reference Point Dosage Given to Date: 30 Gy
Reference Point Session Dosage Given: 2 Gy
Session Number: 40

## 2023-02-06 DIAGNOSIS — F515 Nightmare disorder: Secondary | ICD-10-CM | POA: Diagnosis not present

## 2023-02-06 DIAGNOSIS — I4891 Unspecified atrial fibrillation: Secondary | ICD-10-CM | POA: Diagnosis not present

## 2023-02-06 DIAGNOSIS — G4733 Obstructive sleep apnea (adult) (pediatric): Secondary | ICD-10-CM | POA: Diagnosis not present

## 2023-02-07 ENCOUNTER — Other Ambulatory Visit: Payer: Self-pay | Admitting: Radiation Oncology

## 2023-02-07 ENCOUNTER — Other Ambulatory Visit (HOSPITAL_BASED_OUTPATIENT_CLINIC_OR_DEPARTMENT_OTHER): Payer: Self-pay

## 2023-02-07 MED ORDER — TAMSULOSIN HCL 0.4 MG PO CAPS
0.4000 mg | ORAL_CAPSULE | Freq: Every day | ORAL | 3 refills | Status: DC
  Filled 2023-02-07 – 2023-02-21 (×2): qty 30, 30d supply, fill #0
  Filled 2023-03-18: qty 30, 30d supply, fill #1
  Filled 2023-04-06 – 2023-04-11 (×2): qty 30, 30d supply, fill #2
  Filled 2023-05-19: qty 30, 30d supply, fill #3
  Filled 2023-06-25: qty 30, 30d supply, fill #4
  Filled 2023-08-14: qty 30, 30d supply, fill #5
  Filled 2023-09-29 – 2023-09-30 (×3): qty 30, 30d supply, fill #6
  Filled 2023-10-14 – 2023-11-21 (×3): qty 30, 30d supply, fill #7
  Filled 2023-12-20: qty 30, 30d supply, fill #8
  Filled 2024-01-22: qty 30, 30d supply, fill #9

## 2023-02-07 NOTE — Radiation Completion Notes (Signed)
Patient Name: Christian Mitchell, Christian Mitchell MRN: [id number] Date of Birth: 12/11/1960 Referring Physician: Jettie Pagan, M.D. Date of Service: 2023-02-07 Radiation Oncologist: Margaretmary Bayley, M.D. [facility]                             RADIATION ONCOLOGY END OF TREATMENT NOTE     Diagnosis: C61 Malignant neoplasm of prostate Staging on 2022-06-25: Malignant neoplasm of prostate T=cT1c, N=cN0, M=cM0 Intent: Curative     ==========DELIVERED PLANS==========  First Treatment Date: 2022-12-02 - Last Treatment Date: 2023-02-05   Plan Name: Prostate_Pelv Site: Prostate Technique: IMRT Mode: Photon Dose Per Fraction: 1.8 Gy Prescribed Dose (Delivered / Prescribed): 45 Gy / 45 Gy Prescribed Fxs (Delivered / Prescribed): 25 / 25   Plan Name: Prostate_Bst Site: Prostate Technique: IMRT Mode: Photon Dose Per Fraction: 2 Gy Prescribed Dose (Delivered / Prescribed): 30 Gy / 30 Gy Prescribed Fxs (Delivered / Prescribed): 15 / 15     ==========ON TREATMENT VISIT DATES========== 2022-12-06, 2022-12-13, 2022-12-19, 2022-12-26, 2023-01-03, 2023-01-14, 2023-01-17, 2023-01-24, 2023-01-31     ==========UPCOMING VISITS==========       ==========APPENDIX - ON TREATMENT VISIT NOTES==========   See weekly On Treatment Notes is Epic for details.

## 2023-02-08 ENCOUNTER — Emergency Department (HOSPITAL_BASED_OUTPATIENT_CLINIC_OR_DEPARTMENT_OTHER)
Admission: EM | Admit: 2023-02-08 | Discharge: 2023-02-08 | Disposition: A | Discharge status: Home / Self Care | Payer: 59 | Attending: Emergency Medicine | Admitting: Emergency Medicine

## 2023-02-08 ENCOUNTER — Encounter (HOSPITAL_BASED_OUTPATIENT_CLINIC_OR_DEPARTMENT_OTHER): Payer: Self-pay | Admitting: Emergency Medicine

## 2023-02-08 DIAGNOSIS — J4540 Moderate persistent asthma, uncomplicated: Secondary | ICD-10-CM | POA: Diagnosis not present

## 2023-02-08 DIAGNOSIS — Z79899 Other long term (current) drug therapy: Secondary | ICD-10-CM | POA: Insufficient documentation

## 2023-02-08 DIAGNOSIS — I10 Essential (primary) hypertension: Secondary | ICD-10-CM | POA: Insufficient documentation

## 2023-02-08 DIAGNOSIS — C61 Malignant neoplasm of prostate: Secondary | ICD-10-CM | POA: Diagnosis not present

## 2023-02-08 DIAGNOSIS — R339 Retention of urine, unspecified: Secondary | ICD-10-CM | POA: Insufficient documentation

## 2023-02-08 DIAGNOSIS — Z85820 Personal history of malignant melanoma of skin: Secondary | ICD-10-CM | POA: Diagnosis not present

## 2023-02-08 LAB — CBC WITH DIFFERENTIAL/PLATELET
Abs Immature Granulocytes: 0.01 10*3/uL (ref 0.00–0.07)
Basophils Absolute: 0 10*3/uL (ref 0.0–0.1)
Basophils Relative: 0 %
Eosinophils Absolute: 0.2 10*3/uL (ref 0.0–0.5)
Eosinophils Relative: 3 %
HCT: 30.7 % — ABNORMAL LOW (ref 39.0–52.0)
Hemoglobin: 10.6 g/dL — ABNORMAL LOW (ref 13.0–17.0)
Immature Granulocytes: 0 %
Lymphocytes Relative: 12 %
Lymphs Abs: 0.6 10*3/uL — ABNORMAL LOW (ref 0.7–4.0)
MCH: 32.8 pg (ref 26.0–34.0)
MCHC: 34.5 g/dL (ref 30.0–36.0)
MCV: 95 fL (ref 80.0–100.0)
Monocytes Absolute: 0.5 10*3/uL (ref 0.1–1.0)
Monocytes Relative: 10 %
Neutro Abs: 3.9 10*3/uL (ref 1.7–7.7)
Neutrophils Relative %: 75 %
Platelets: 172 10*3/uL (ref 150–400)
RBC: 3.23 MIL/uL — ABNORMAL LOW (ref 4.22–5.81)
RDW: 13.1 % (ref 11.5–15.5)
WBC: 5.2 10*3/uL (ref 4.0–10.5)
nRBC: 0 % (ref 0.0–0.2)

## 2023-02-08 LAB — URINALYSIS, W/ REFLEX TO CULTURE (INFECTION SUSPECTED)
Glucose, UA: 100 mg/dL — AB
Hgb urine dipstick: NEGATIVE
Ketones, ur: NEGATIVE mg/dL
Leukocytes,Ua: NEGATIVE
Nitrite: POSITIVE — AB
Protein, ur: 100 mg/dL — AB
Specific Gravity, Urine: 1.02 (ref 1.005–1.030)
pH: 5 (ref 5.0–8.0)

## 2023-02-08 LAB — BASIC METABOLIC PANEL
Anion gap: 9 (ref 5–15)
BUN: 16 mg/dL (ref 6–20)
CO2: 23 mmol/L (ref 22–32)
Calcium: 9.1 mg/dL (ref 8.9–10.3)
Chloride: 109 mmol/L (ref 98–111)
Creatinine, Ser: 1.13 mg/dL (ref 0.61–1.24)
GFR, Estimated: >60 mL/min (ref >60)
Glucose, Bld: 96 mg/dL (ref 70–99)
Potassium: 3.4 mmol/L — ABNORMAL LOW (ref 3.5–5.1)
Sodium: 141 mmol/L (ref 135–145)

## 2023-02-08 MED ORDER — PHENYLEPHRINE 80 MCG/ML (10ML) SYRINGE FOR IV PUSH (FOR BLOOD PRESSURE SUPPORT)
PREFILLED_SYRINGE | INTRAVENOUS | Status: AC
  Filled 2023-02-08: qty 10

## 2023-02-08 MED ORDER — NOREPINEPHRINE 4 MG/250ML-% IV SOLN
INTRAVENOUS | Status: AC
  Filled 2023-02-08: qty 250

## 2023-02-08 MED ORDER — LIDOCAINE HCL URETHRAL/MUCOSAL 2 % EX GEL
1.0000 | Freq: Once | CUTANEOUS | Status: AC
  Administered 2023-02-08: 1 via URETHRAL
  Filled 2023-02-08: qty 11

## 2023-02-08 NOTE — ED Notes (Signed)

## 2023-02-08 NOTE — ED Notes (Signed)
253 ml - bladder scan

## 2023-02-08 NOTE — ED Triage Notes (Signed)
Pt reports urinary retention, frequency, dysuria since Wed; sts he completed radiation tx for prostate CA on Wed
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
## 2023-02-10 ENCOUNTER — Other Ambulatory Visit (HOSPITAL_COMMUNITY): Payer: Self-pay

## 2023-02-10 ENCOUNTER — Other Ambulatory Visit: Payer: Self-pay

## 2023-02-10 ENCOUNTER — Other Ambulatory Visit (HOSPITAL_BASED_OUTPATIENT_CLINIC_OR_DEPARTMENT_OTHER): Payer: Self-pay

## 2023-02-10 DIAGNOSIS — R338 Other retention of urine: Secondary | ICD-10-CM | POA: Diagnosis not present

## 2023-02-10 MED ORDER — URIBEL 118 MG PO CAPS
118.0000 mg | ORAL_CAPSULE | Freq: Three times a day (TID) | ORAL | 1 refills | Status: DC | PRN
  Filled 2023-02-10: qty 30, 10d supply, fill #0
  Filled 2023-06-25: qty 30, 10d supply, fill #1

## 2023-02-10 MED ORDER — SILODOSIN 8 MG PO CAPS
8.0000 mg | ORAL_CAPSULE | Freq: Every day | ORAL | 11 refills | Status: DC
  Filled 2023-02-10: qty 30, 30d supply, fill #0
  Filled 2023-03-05 – 2023-03-06 (×2): qty 30, 30d supply, fill #1
  Filled 2023-04-06: qty 30, 30d supply, fill #2
  Filled 2023-05-19: qty 30, 30d supply, fill #3
  Filled 2023-06-25: qty 30, 30d supply, fill #4
  Filled 2023-08-14: qty 30, 30d supply, fill #5
  Filled 2023-09-29 – 2023-10-14 (×3): qty 30, 30d supply, fill #6
  Filled 2023-11-21: qty 30, 30d supply, fill #7
  Filled 2023-12-26 – 2024-01-22 (×3): qty 30, 30d supply, fill #8

## 2023-02-11 ENCOUNTER — Other Ambulatory Visit (HOSPITAL_BASED_OUTPATIENT_CLINIC_OR_DEPARTMENT_OTHER): Payer: Self-pay

## 2023-02-17 ENCOUNTER — Other Ambulatory Visit (HOSPITAL_BASED_OUTPATIENT_CLINIC_OR_DEPARTMENT_OTHER): Payer: Self-pay

## 2023-02-17 DIAGNOSIS — R338 Other retention of urine: Secondary | ICD-10-CM | POA: Diagnosis not present

## 2023-02-17 MED ORDER — OXYCODONE-ACETAMINOPHEN 5-325 MG PO TABS
1.0000 | ORAL_TABLET | ORAL | 0 refills | Status: AC | PRN
  Filled 2023-02-17: qty 6, 1d supply, fill #0

## 2023-02-20 ENCOUNTER — Other Ambulatory Visit: Payer: Self-pay | Admitting: Urology

## 2023-02-20 DIAGNOSIS — C61 Malignant neoplasm of prostate: Secondary | ICD-10-CM

## 2023-02-20 NOTE — Progress Notes (Signed)
  Radiation Oncology         [phone]) [phone] ________________________________  Name: Christian Mitchell MRN: [id number]  Date: 02/05/2023  DOB: July 09, 1961  End of Treatment Note  Diagnosis:   62 y.o. gentleman with Stage T1c adenocarcinoma of the prostate with Gleason Score of 4+4, and PSA of 5.84.      Indication for treatment:  Curative, Definitive Radiotherapy       Radiation treatment dates:   12/02/22 - 02/05/23  Site/dose:  1. The prostate, seminal vesicles, and pelvic lymph nodes were initially treated to 45 Gy in 25 fractions of 1.8 Gy  2. The prostate only was boosted to 75 Gy with 15 additional fractions of 2.0 Gy   Beams/energy:  1. The prostate, seminal vesicles, and pelvic lymph nodes were initially treated using VMAT intensity modulated radiotherapy delivering 6 megavolt photons. Image guidance was performed with CB-CT studies prior to each fraction. He was immobilized with a body fix lower extremity mold.  2. the prostate only was boosted using VMAT intensity modulated radiotherapy delivering 6 megavolt photons. Image guidance was performed with CB-CT studies prior to each fraction. He was immobilized with a body fix lower extremity mold.  Narrative: The patient tolerated radiation treatment relatively well.   The patient experienced some minor urinary irritation and modest fatigue.  He did report nocturia every hour but this was improved with Flomax daily.  He was also diagnosed with a distal ureteral stone which required ureteroscopy and stent placement during his course of radiation.  Plan: The patient will receive a call in about one month from the radiation oncology department. He will continue follow up with his urologist, Dr. Cardell Peach, as well and has a follow-up visit scheduled on 02/21/2023.  ________________________________  Artist Pais. Kathrynn Running, M.D.

## 2023-02-21 ENCOUNTER — Other Ambulatory Visit (HOSPITAL_BASED_OUTPATIENT_CLINIC_OR_DEPARTMENT_OTHER): Payer: Self-pay

## 2023-03-05 ENCOUNTER — Other Ambulatory Visit (HOSPITAL_BASED_OUTPATIENT_CLINIC_OR_DEPARTMENT_OTHER): Payer: Self-pay

## 2023-03-05 ENCOUNTER — Other Ambulatory Visit: Payer: Self-pay

## 2023-03-06 ENCOUNTER — Other Ambulatory Visit (HOSPITAL_BASED_OUTPATIENT_CLINIC_OR_DEPARTMENT_OTHER): Payer: Self-pay

## 2023-03-11 ENCOUNTER — Ambulatory Visit
Admission: RE | Admit: 2023-03-11 | Discharge: 2023-03-11 | Disposition: A | Discharge status: Home / Self Care | Payer: 59 | Source: Ambulatory Visit | Attending: Radiation Oncology | Admitting: Radiation Oncology

## 2023-03-11 DIAGNOSIS — C61 Malignant neoplasm of prostate: Secondary | ICD-10-CM | POA: Insufficient documentation

## 2023-03-11 NOTE — Progress Notes (Signed)
  Radiation Oncology         [phone] ________________________________  Name: Christian Mitchell MRN: [id number]  Date of Service: 03/11/2023  DOB: Feb 02, 1961  Post Treatment Telephone Note  Diagnosis:  62 y.o. gentleman with Stage T1c adenocarcinoma of the prostate with Gleason Score of 4+4, and PSA of 5.84.       Indication for treatment:  Curative, Definitive Radiotherapy        Radiation treatment dates:   12/02/22 - 02/05/23(as documented in provider EOT note)  Pre Treatment IPSS Score: 19 (as documented in the provider consult note)  The patient was not available for call today. Voicemail left.  Patient (has a scheduled follow up visit with his urologist, Dr. Cardell Peach, on 03/2023 for ongoing surveillance. He was counseled that PSA levels will be drawn in the urology office, and was reassured that additional time is expected to improve bowel and bladder symptoms. He was encouraged to call back with concerns or questions regarding radiation.  This concludes the interview.   Ruel Favors, LPN

## 2023-03-14 ENCOUNTER — Other Ambulatory Visit: Payer: Self-pay | Admitting: Radiation Oncology

## 2023-03-14 ENCOUNTER — Other Ambulatory Visit (HOSPITAL_BASED_OUTPATIENT_CLINIC_OR_DEPARTMENT_OTHER): Payer: Self-pay

## 2023-03-16 MED ORDER — MEGESTROL ACETATE 40 MG PO TABS
40.0000 mg | ORAL_TABLET | Freq: Two times a day (BID) | ORAL | 0 refills | Status: DC
  Filled 2023-03-16: qty 60, 30d supply, fill #0
  Filled 2023-06-25: qty 60, 30d supply, fill #1
  Filled 2023-08-14: qty 60, 30d supply, fill #2

## 2023-03-18 ENCOUNTER — Other Ambulatory Visit (HOSPITAL_COMMUNITY): Payer: Self-pay | Admitting: Physician Assistant

## 2023-03-18 ENCOUNTER — Other Ambulatory Visit (HOSPITAL_BASED_OUTPATIENT_CLINIC_OR_DEPARTMENT_OTHER): Payer: Self-pay

## 2023-03-18 MED ORDER — FLECAINIDE ACETATE 50 MG PO TABS
50.0000 mg | ORAL_TABLET | Freq: Two times a day (BID) | ORAL | 6 refills | Status: DC
  Filled 2023-03-18: qty 60, 30d supply, fill #0
  Filled 2023-06-25: qty 60, 30d supply, fill #1
  Filled 2023-09-29 – 2023-09-30 (×3): qty 60, 30d supply, fill #2
  Filled 2023-10-14 – 2023-11-21 (×3): qty 60, 30d supply, fill #3
  Filled 2023-12-20: qty 60, 30d supply, fill #4
  Filled 2024-01-22: qty 60, 30d supply, fill #5
  Filled 2024-02-17: qty 60, 30d supply, fill #6

## 2023-03-18 MED ORDER — METOPROLOL SUCCINATE ER 25 MG PO TB24
25.0000 mg | ORAL_TABLET | Freq: Every day | ORAL | 0 refills | Status: DC
  Filled 2023-03-18: qty 30, 30d supply, fill #0

## 2023-03-20 ENCOUNTER — Other Ambulatory Visit (HOSPITAL_BASED_OUTPATIENT_CLINIC_OR_DEPARTMENT_OTHER): Payer: Self-pay

## 2023-03-20 DIAGNOSIS — C61 Malignant neoplasm of prostate: Secondary | ICD-10-CM | POA: Diagnosis not present

## 2023-03-20 DIAGNOSIS — R634 Abnormal weight loss: Secondary | ICD-10-CM | POA: Diagnosis not present

## 2023-03-20 DIAGNOSIS — F419 Anxiety disorder, unspecified: Secondary | ICD-10-CM | POA: Diagnosis not present

## 2023-03-20 MED ORDER — SERTRALINE HCL 50 MG PO TABS
100.0000 mg | ORAL_TABLET | Freq: Every day | ORAL | 1 refills | Status: AC
  Filled 2023-03-20: qty 60, 30d supply, fill #0
  Filled 2023-05-19 – 2023-06-25 (×2): qty 60, 30d supply, fill #1
  Filled 2023-08-14: qty 60, 30d supply, fill #2
  Filled 2023-09-29 – 2023-09-30 (×3): qty 60, 30d supply, fill #3

## 2023-04-06 ENCOUNTER — Other Ambulatory Visit (HOSPITAL_BASED_OUTPATIENT_CLINIC_OR_DEPARTMENT_OTHER): Payer: Self-pay

## 2023-04-07 ENCOUNTER — Other Ambulatory Visit: Payer: Self-pay

## 2023-04-07 ENCOUNTER — Other Ambulatory Visit (HOSPITAL_BASED_OUTPATIENT_CLINIC_OR_DEPARTMENT_OTHER): Payer: Self-pay

## 2023-04-07 MED ORDER — ALBUTEROL SULFATE HFA 108 (90 BASE) MCG/ACT IN AERS
1.0000 | INHALATION_SPRAY | Freq: Four times a day (QID) | RESPIRATORY_TRACT | 1 refills | Status: DC | PRN
  Filled 2023-04-07: qty 6.7, 25d supply, fill #0
  Filled 2023-05-19: qty 6.7, 25d supply, fill #1

## 2023-04-08 ENCOUNTER — Other Ambulatory Visit (HOSPITAL_BASED_OUTPATIENT_CLINIC_OR_DEPARTMENT_OTHER): Payer: Self-pay

## 2023-04-08 MED ORDER — IBUPROFEN 800 MG PO TABS
800.0000 mg | ORAL_TABLET | Freq: Three times a day (TID) | ORAL | 0 refills | Status: AC | PRN
  Filled 2023-04-08: qty 30, 10d supply, fill #0

## 2023-04-11 DIAGNOSIS — N200 Calculus of kidney: Secondary | ICD-10-CM | POA: Diagnosis not present

## 2023-04-16 ENCOUNTER — Other Ambulatory Visit (HOSPITAL_BASED_OUTPATIENT_CLINIC_OR_DEPARTMENT_OTHER): Payer: Self-pay

## 2023-04-17 ENCOUNTER — Inpatient Hospital Stay: Payer: 59 | Attending: Adult Health | Admitting: *Deleted

## 2023-04-17 ENCOUNTER — Encounter: Payer: Self-pay | Admitting: *Deleted

## 2023-04-18 ENCOUNTER — Other Ambulatory Visit (HOSPITAL_BASED_OUTPATIENT_CLINIC_OR_DEPARTMENT_OTHER): Payer: Self-pay

## 2023-04-29 ENCOUNTER — Encounter: Payer: Self-pay | Admitting: *Deleted

## 2023-05-06 DIAGNOSIS — C61 Malignant neoplasm of prostate: Secondary | ICD-10-CM | POA: Diagnosis not present

## 2023-05-06 DIAGNOSIS — R3912 Poor urinary stream: Secondary | ICD-10-CM | POA: Diagnosis not present

## 2023-05-19 ENCOUNTER — Other Ambulatory Visit (HOSPITAL_BASED_OUTPATIENT_CLINIC_OR_DEPARTMENT_OTHER): Payer: Self-pay

## 2023-05-19 ENCOUNTER — Other Ambulatory Visit: Payer: Self-pay | Admitting: Radiation Oncology

## 2023-05-19 ENCOUNTER — Other Ambulatory Visit (HOSPITAL_COMMUNITY): Payer: Self-pay | Admitting: Physician Assistant

## 2023-05-20 ENCOUNTER — Other Ambulatory Visit (HOSPITAL_COMMUNITY): Payer: Self-pay

## 2023-05-20 ENCOUNTER — Other Ambulatory Visit (HOSPITAL_BASED_OUTPATIENT_CLINIC_OR_DEPARTMENT_OTHER): Payer: Self-pay

## 2023-05-20 ENCOUNTER — Other Ambulatory Visit: Payer: Self-pay

## 2023-05-20 MED ORDER — DILTIAZEM HCL 30 MG PO TABS
30.0000 mg | ORAL_TABLET | ORAL | 0 refills | Status: DC | PRN
  Filled 2023-05-20: qty 45, 8d supply, fill #0

## 2023-05-20 MED ORDER — CARISOPRODOL 350 MG PO TABS
350.0000 mg | ORAL_TABLET | Freq: Two times a day (BID) | ORAL | 2 refills | Status: DC
  Filled 2023-05-20: qty 60, 30d supply, fill #0
  Filled 2023-06-25: qty 60, 30d supply, fill #1
  Filled 2023-08-14: qty 60, 30d supply, fill #2

## 2023-05-20 MED ORDER — METOPROLOL SUCCINATE ER 25 MG PO TB24
25.0000 mg | ORAL_TABLET | Freq: Every day | ORAL | 0 refills | Status: DC
  Filled 2023-05-20: qty 30, 30d supply, fill #0

## 2023-05-20 MED ORDER — METOPROLOL SUCCINATE ER 25 MG PO TB24
25.0000 mg | ORAL_TABLET | Freq: Every day | ORAL | 4 refills | Status: DC
  Filled 2023-05-20: qty 30, 30d supply, fill #0
  Filled 2023-06-25: qty 30, 30d supply, fill #1
  Filled 2023-08-14: qty 30, 30d supply, fill #2
  Filled 2023-09-29 – 2023-09-30 (×2): qty 30, 30d supply, fill #3
  Filled 2023-10-24 – 2023-11-21 (×2): qty 30, 30d supply, fill #4

## 2023-05-22 ENCOUNTER — Other Ambulatory Visit (HOSPITAL_BASED_OUTPATIENT_CLINIC_OR_DEPARTMENT_OTHER): Payer: Self-pay

## 2023-06-25 ENCOUNTER — Other Ambulatory Visit: Payer: Self-pay

## 2023-06-25 ENCOUNTER — Other Ambulatory Visit (HOSPITAL_COMMUNITY): Payer: Self-pay | Admitting: Physician Assistant

## 2023-06-25 ENCOUNTER — Other Ambulatory Visit (HOSPITAL_BASED_OUTPATIENT_CLINIC_OR_DEPARTMENT_OTHER): Payer: Self-pay

## 2023-06-25 MED ORDER — DILTIAZEM HCL 30 MG PO TABS
30.0000 mg | ORAL_TABLET | ORAL | 0 refills | Status: AC | PRN
  Filled 2023-06-25: qty 45, 8d supply, fill #0

## 2023-08-14 ENCOUNTER — Other Ambulatory Visit (HOSPITAL_BASED_OUTPATIENT_CLINIC_OR_DEPARTMENT_OTHER): Payer: Self-pay

## 2023-08-14 ENCOUNTER — Other Ambulatory Visit: Payer: Self-pay

## 2023-08-14 ENCOUNTER — Other Ambulatory Visit (HOSPITAL_COMMUNITY): Payer: Self-pay | Admitting: Physician Assistant

## 2023-08-15 ENCOUNTER — Encounter (HOSPITAL_BASED_OUTPATIENT_CLINIC_OR_DEPARTMENT_OTHER): Payer: Self-pay

## 2023-08-15 ENCOUNTER — Other Ambulatory Visit (HOSPITAL_BASED_OUTPATIENT_CLINIC_OR_DEPARTMENT_OTHER): Payer: Self-pay

## 2023-08-19 ENCOUNTER — Other Ambulatory Visit (HOSPITAL_BASED_OUTPATIENT_CLINIC_OR_DEPARTMENT_OTHER): Payer: Self-pay

## 2023-09-29 ENCOUNTER — Other Ambulatory Visit: Payer: Self-pay

## 2023-09-29 ENCOUNTER — Other Ambulatory Visit (HOSPITAL_COMMUNITY): Payer: Self-pay

## 2023-09-30 ENCOUNTER — Other Ambulatory Visit (HOSPITAL_BASED_OUTPATIENT_CLINIC_OR_DEPARTMENT_OTHER): Payer: Self-pay

## 2023-09-30 ENCOUNTER — Other Ambulatory Visit (HOSPITAL_COMMUNITY): Payer: Self-pay

## 2023-09-30 ENCOUNTER — Other Ambulatory Visit: Payer: Self-pay

## 2023-09-30 MED ORDER — URO-MP 118 MG PO CAPS
1.0000 | ORAL_CAPSULE | Freq: Three times a day (TID) | ORAL | 1 refills | Status: AC | PRN
  Filled 2023-09-30 – 2023-12-20 (×4): qty 30, 10d supply, fill #0

## 2023-10-01 ENCOUNTER — Other Ambulatory Visit (HOSPITAL_BASED_OUTPATIENT_CLINIC_OR_DEPARTMENT_OTHER): Payer: Self-pay

## 2023-10-02 ENCOUNTER — Other Ambulatory Visit: Payer: Self-pay | Admitting: Radiation Oncology

## 2023-10-02 ENCOUNTER — Other Ambulatory Visit (HOSPITAL_BASED_OUTPATIENT_CLINIC_OR_DEPARTMENT_OTHER): Payer: Self-pay

## 2023-10-07 ENCOUNTER — Other Ambulatory Visit: Payer: Self-pay

## 2023-10-07 ENCOUNTER — Other Ambulatory Visit (HOSPITAL_BASED_OUTPATIENT_CLINIC_OR_DEPARTMENT_OTHER): Payer: Self-pay

## 2023-10-07 MED ORDER — CARISOPRODOL 350 MG PO TABS
350.0000 mg | ORAL_TABLET | Freq: Two times a day (BID) | ORAL | 2 refills | Status: DC
  Filled 2023-10-07 – 2023-10-14 (×2): qty 60, 30d supply, fill #0
  Filled 2023-11-21: qty 60, 30d supply, fill #1
  Filled 2023-12-20: qty 60, 30d supply, fill #2

## 2023-10-07 MED ORDER — MEGESTROL ACETATE 40 MG PO TABS
40.0000 mg | ORAL_TABLET | Freq: Two times a day (BID) | ORAL | 0 refills | Status: DC
  Filled 2023-10-07 – 2023-10-14 (×2): qty 60, 30d supply, fill #0
  Filled 2023-11-21: qty 60, 30d supply, fill #1
  Filled 2023-12-20: qty 60, 30d supply, fill #2

## 2023-10-13 ENCOUNTER — Other Ambulatory Visit (HOSPITAL_BASED_OUTPATIENT_CLINIC_OR_DEPARTMENT_OTHER): Payer: Self-pay

## 2023-10-14 ENCOUNTER — Other Ambulatory Visit (HOSPITAL_BASED_OUTPATIENT_CLINIC_OR_DEPARTMENT_OTHER): Payer: Self-pay

## 2023-10-16 ENCOUNTER — Other Ambulatory Visit: Payer: Self-pay

## 2023-10-16 ENCOUNTER — Other Ambulatory Visit (HOSPITAL_BASED_OUTPATIENT_CLINIC_OR_DEPARTMENT_OTHER): Payer: Self-pay

## 2023-11-05 ENCOUNTER — Other Ambulatory Visit (HOSPITAL_BASED_OUTPATIENT_CLINIC_OR_DEPARTMENT_OTHER): Payer: Self-pay

## 2023-11-11 DIAGNOSIS — C61 Malignant neoplasm of prostate: Secondary | ICD-10-CM | POA: Diagnosis not present

## 2023-11-21 ENCOUNTER — Other Ambulatory Visit (HOSPITAL_BASED_OUTPATIENT_CLINIC_OR_DEPARTMENT_OTHER): Payer: Self-pay

## 2023-12-02 ENCOUNTER — Other Ambulatory Visit (HOSPITAL_BASED_OUTPATIENT_CLINIC_OR_DEPARTMENT_OTHER): Payer: Self-pay

## 2023-12-02 DIAGNOSIS — R0981 Nasal congestion: Secondary | ICD-10-CM | POA: Diagnosis not present

## 2023-12-02 DIAGNOSIS — R52 Pain, unspecified: Secondary | ICD-10-CM | POA: Diagnosis not present

## 2023-12-02 DIAGNOSIS — R059 Cough, unspecified: Secondary | ICD-10-CM | POA: Diagnosis not present

## 2023-12-02 DIAGNOSIS — R42 Dizziness and giddiness: Secondary | ICD-10-CM | POA: Diagnosis not present

## 2023-12-02 MED ORDER — DOXYCYCLINE HYCLATE 100 MG PO TABS
100.0000 mg | ORAL_TABLET | Freq: Two times a day (BID) | ORAL | 0 refills | Status: AC
  Filled 2023-12-02: qty 14, 7d supply, fill #0

## 2023-12-20 ENCOUNTER — Other Ambulatory Visit (HOSPITAL_BASED_OUTPATIENT_CLINIC_OR_DEPARTMENT_OTHER): Payer: Self-pay

## 2023-12-22 ENCOUNTER — Other Ambulatory Visit: Payer: Self-pay

## 2023-12-22 ENCOUNTER — Other Ambulatory Visit (HOSPITAL_BASED_OUTPATIENT_CLINIC_OR_DEPARTMENT_OTHER): Payer: Self-pay

## 2023-12-22 MED ORDER — SERTRALINE HCL 50 MG PO TABS
100.0000 mg | ORAL_TABLET | Freq: Every day | ORAL | 0 refills | Status: DC
  Filled 2023-12-22: qty 60, 30d supply, fill #0

## 2023-12-23 ENCOUNTER — Other Ambulatory Visit (HOSPITAL_BASED_OUTPATIENT_CLINIC_OR_DEPARTMENT_OTHER): Payer: Self-pay

## 2023-12-26 ENCOUNTER — Other Ambulatory Visit (HOSPITAL_BASED_OUTPATIENT_CLINIC_OR_DEPARTMENT_OTHER): Payer: Self-pay

## 2023-12-30 ENCOUNTER — Other Ambulatory Visit (HOSPITAL_COMMUNITY): Payer: Self-pay

## 2024-01-09 ENCOUNTER — Other Ambulatory Visit (HOSPITAL_BASED_OUTPATIENT_CLINIC_OR_DEPARTMENT_OTHER): Payer: Self-pay

## 2024-01-22 ENCOUNTER — Other Ambulatory Visit (HOSPITAL_BASED_OUTPATIENT_CLINIC_OR_DEPARTMENT_OTHER): Payer: Self-pay

## 2024-01-22 ENCOUNTER — Other Ambulatory Visit: Payer: Self-pay | Admitting: Radiation Oncology

## 2024-01-22 MED ORDER — MEGESTROL ACETATE 40 MG PO TABS
40.0000 mg | ORAL_TABLET | Freq: Two times a day (BID) | ORAL | 0 refills | Status: DC
  Filled 2024-01-22: qty 60, 30d supply, fill #0
  Filled 2024-02-17: qty 60, 30d supply, fill #1

## 2024-01-22 MED ORDER — SERTRALINE HCL 50 MG PO TABS
100.0000 mg | ORAL_TABLET | Freq: Every day | ORAL | 0 refills | Status: DC
  Filled 2024-01-22: qty 20, 10d supply, fill #0

## 2024-01-22 MED ORDER — CARISOPRODOL 350 MG PO TABS
350.0000 mg | ORAL_TABLET | Freq: Two times a day (BID) | ORAL | 2 refills | Status: DC
  Filled 2024-01-22: qty 60, 30d supply, fill #0
  Filled 2024-02-17: qty 60, 30d supply, fill #1

## 2024-01-30 ENCOUNTER — Other Ambulatory Visit (HOSPITAL_BASED_OUTPATIENT_CLINIC_OR_DEPARTMENT_OTHER): Payer: Self-pay

## 2024-01-30 DIAGNOSIS — L03116 Cellulitis of left lower limb: Secondary | ICD-10-CM | POA: Diagnosis not present

## 2024-01-30 MED ORDER — CEFDINIR 300 MG PO CAPS
300.0000 mg | ORAL_CAPSULE | Freq: Two times a day (BID) | ORAL | 0 refills | Status: AC
  Filled 2024-01-30: qty 20, 10d supply, fill #0

## 2024-01-30 MED ORDER — KETOROLAC TROMETHAMINE 10 MG PO TABS
10.0000 mg | ORAL_TABLET | Freq: Four times a day (QID) | ORAL | 0 refills | Status: AC | PRN
  Filled 2024-01-30: qty 20, 5d supply, fill #0

## 2024-02-17 ENCOUNTER — Other Ambulatory Visit: Payer: Self-pay

## 2024-02-17 ENCOUNTER — Other Ambulatory Visit (HOSPITAL_BASED_OUTPATIENT_CLINIC_OR_DEPARTMENT_OTHER): Payer: Self-pay

## 2024-02-17 ENCOUNTER — Other Ambulatory Visit: Payer: Self-pay | Admitting: Radiation Oncology

## 2024-02-17 MED ORDER — SERTRALINE HCL 50 MG PO TABS
100.0000 mg | ORAL_TABLET | Freq: Every day | ORAL | 0 refills | Status: DC
  Filled 2024-02-17: qty 20, 10d supply, fill #0

## 2024-02-17 MED ORDER — ALBUTEROL SULFATE HFA 108 (90 BASE) MCG/ACT IN AERS
1.0000 | INHALATION_SPRAY | Freq: Four times a day (QID) | RESPIRATORY_TRACT | 0 refills | Status: DC | PRN
  Filled 2024-02-17: qty 6.7, 25d supply, fill #0

## 2024-02-17 MED ORDER — TAMSULOSIN HCL 0.4 MG PO CAPS
0.4000 mg | ORAL_CAPSULE | Freq: Every day | ORAL | 3 refills | Status: DC
  Filled 2024-02-17: qty 30, 30d supply, fill #0

## 2024-02-19 ENCOUNTER — Other Ambulatory Visit (HOSPITAL_COMMUNITY): Payer: Self-pay | Admitting: Radiation Oncology

## 2024-02-19 ENCOUNTER — Other Ambulatory Visit (HOSPITAL_BASED_OUTPATIENT_CLINIC_OR_DEPARTMENT_OTHER): Payer: Self-pay

## 2024-02-19 ENCOUNTER — Other Ambulatory Visit: Payer: Self-pay | Admitting: Radiation Oncology

## 2024-02-20 ENCOUNTER — Other Ambulatory Visit (HOSPITAL_BASED_OUTPATIENT_CLINIC_OR_DEPARTMENT_OTHER): Payer: Self-pay

## 2024-02-20 MED ORDER — TAMSULOSIN HCL 0.4 MG PO CAPS
0.4000 mg | ORAL_CAPSULE | Freq: Every day | ORAL | 3 refills | Status: AC
Start: 2024-02-20 — End: ?
  Filled 2024-02-20: qty 90, 90d supply, fill #0
  Filled 2024-03-17: qty 30, 30d supply, fill #0
  Filled 2024-04-30: qty 30, 30d supply, fill #1
  Filled 2024-06-29: qty 30, 30d supply, fill #2
  Filled 2024-08-12: qty 30, 30d supply, fill #3

## 2024-02-20 MED ORDER — FLECAINIDE ACETATE 50 MG PO TABS
50.0000 mg | ORAL_TABLET | Freq: Two times a day (BID) | ORAL | 6 refills | Status: AC
  Filled 2024-02-20 – 2024-03-17 (×2): qty 60, 30d supply, fill #0
  Filled 2024-04-30: qty 60, 30d supply, fill #1
  Filled 2024-06-29: qty 60, 30d supply, fill #2
  Filled 2024-08-12: qty 60, 30d supply, fill #3

## 2024-02-20 MED ORDER — MEGESTROL ACETATE 40 MG PO TABS
40.0000 mg | ORAL_TABLET | Freq: Two times a day (BID) | ORAL | 0 refills | Status: DC
  Filled 2024-02-20: qty 180, 90d supply, fill #0
  Filled 2024-03-17: qty 60, 30d supply, fill #0

## 2024-02-20 MED ORDER — CARISOPRODOL 350 MG PO TABS
350.0000 mg | ORAL_TABLET | Freq: Two times a day (BID) | ORAL | 2 refills | Status: DC
  Filled 2024-02-20 – 2024-03-18 (×2): qty 60, 30d supply, fill #0
  Filled 2024-04-30: qty 60, 30d supply, fill #1
  Filled 2024-06-29: qty 60, 30d supply, fill #2

## 2024-02-20 MED ORDER — ALBUTEROL SULFATE HFA 108 (90 BASE) MCG/ACT IN AERS
1.0000 | INHALATION_SPRAY | Freq: Four times a day (QID) | RESPIRATORY_TRACT | 0 refills | Status: DC | PRN
  Filled 2024-02-20 – 2024-03-17 (×2): qty 6.7, 25d supply, fill #0

## 2024-02-20 MED ORDER — SERTRALINE HCL 50 MG PO TABS
100.0000 mg | ORAL_TABLET | Freq: Every day | ORAL | 0 refills | Status: DC
  Filled 2024-02-20 – 2024-03-17 (×2): qty 20, 10d supply, fill #0

## 2024-03-17 ENCOUNTER — Other Ambulatory Visit: Payer: Self-pay

## 2024-03-17 ENCOUNTER — Telehealth: Payer: Self-pay

## 2024-03-17 ENCOUNTER — Other Ambulatory Visit (HOSPITAL_COMMUNITY): Payer: Self-pay | Admitting: Radiation Oncology

## 2024-03-17 ENCOUNTER — Other Ambulatory Visit (HOSPITAL_BASED_OUTPATIENT_CLINIC_OR_DEPARTMENT_OTHER): Payer: Self-pay

## 2024-03-17 ENCOUNTER — Other Ambulatory Visit: Payer: Self-pay | Admitting: Radiation Oncology

## 2024-03-17 MED ORDER — SILODOSIN 8 MG PO CAPS
8.0000 mg | ORAL_CAPSULE | Freq: Every day | ORAL | 11 refills | Status: AC
  Filled 2024-03-17: qty 30, 30d supply, fill #0
  Filled 2024-04-30: qty 30, 30d supply, fill #1
  Filled 2024-06-29: qty 30, 30d supply, fill #2
  Filled 2024-08-12: qty 30, 30d supply, fill #3

## 2024-03-17 MED ORDER — MEGESTROL ACETATE 40 MG PO TABS
40.0000 mg | ORAL_TABLET | Freq: Two times a day (BID) | ORAL | 11 refills | Status: AC
  Filled 2024-03-17 – 2024-04-30 (×2): qty 60, 30d supply, fill #0
  Filled 2024-06-29: qty 60, 30d supply, fill #1
  Filled 2024-08-12: qty 60, 30d supply, fill #2

## 2024-03-17 NOTE — Telephone Encounter (Signed)
 RN called pharmacy back in reference to refill on Megace  40 mg per Dr. Lorri Rota they will need to go through Dr. Richerd Chant office at [facility] for all future refills.  Christian Mitchell was also notified of this information.

## 2024-03-18 ENCOUNTER — Other Ambulatory Visit (HOSPITAL_BASED_OUTPATIENT_CLINIC_OR_DEPARTMENT_OTHER): Payer: Self-pay

## 2024-04-30 ENCOUNTER — Other Ambulatory Visit (HOSPITAL_COMMUNITY): Payer: Self-pay | Admitting: Radiation Oncology

## 2024-04-30 ENCOUNTER — Other Ambulatory Visit (HOSPITAL_COMMUNITY): Payer: Self-pay | Admitting: Physician Assistant

## 2024-04-30 ENCOUNTER — Other Ambulatory Visit (HOSPITAL_BASED_OUTPATIENT_CLINIC_OR_DEPARTMENT_OTHER): Payer: Self-pay

## 2024-04-30 ENCOUNTER — Other Ambulatory Visit: Payer: Self-pay | Admitting: Radiation Oncology

## 2024-04-30 ENCOUNTER — Other Ambulatory Visit (HOSPITAL_BASED_OUTPATIENT_CLINIC_OR_DEPARTMENT_OTHER): Payer: Self-pay | Admitting: Family Medicine

## 2024-04-30 MED ORDER — ALBUTEROL SULFATE HFA 108 (90 BASE) MCG/ACT IN AERS
1.0000 | INHALATION_SPRAY | Freq: Four times a day (QID) | RESPIRATORY_TRACT | 0 refills | Status: DC | PRN
  Filled 2024-04-30: qty 6.7, 25d supply, fill #0

## 2024-04-30 MED ORDER — SERTRALINE HCL 50 MG PO TABS
100.0000 mg | ORAL_TABLET | Freq: Every day | ORAL | 0 refills | Status: DC
Start: 2024-04-30 — End: 2024-06-29
  Filled 2024-04-30: qty 20, 10d supply, fill #0

## 2024-05-03 ENCOUNTER — Other Ambulatory Visit: Payer: Self-pay

## 2024-05-03 ENCOUNTER — Other Ambulatory Visit (HOSPITAL_BASED_OUTPATIENT_CLINIC_OR_DEPARTMENT_OTHER): Payer: Self-pay

## 2024-05-03 MED ORDER — METOPROLOL SUCCINATE ER 25 MG PO TB24
25.0000 mg | ORAL_TABLET | Freq: Every day | ORAL | 0 refills | Status: AC
  Filled 2024-05-03: qty 30, 30d supply, fill #0

## 2024-05-07 ENCOUNTER — Other Ambulatory Visit (HOSPITAL_BASED_OUTPATIENT_CLINIC_OR_DEPARTMENT_OTHER): Payer: Self-pay

## 2024-05-07 DIAGNOSIS — S0081XA Abrasion of other part of head, initial encounter: Secondary | ICD-10-CM | POA: Diagnosis not present

## 2024-05-07 MED ORDER — BACITRACIN ZINC 500 UNIT/GM EX OINT
1.0000 | TOPICAL_OINTMENT | Freq: Two times a day (BID) | CUTANEOUS | 0 refills | Status: AC
  Filled 2024-05-07: qty 28, 30d supply, fill #0

## 2024-05-10 ENCOUNTER — Other Ambulatory Visit (HOSPITAL_BASED_OUTPATIENT_CLINIC_OR_DEPARTMENT_OTHER): Payer: Self-pay

## 2024-05-10 DIAGNOSIS — S41151A Open bite of right upper arm, initial encounter: Secondary | ICD-10-CM | POA: Diagnosis not present

## 2024-05-10 DIAGNOSIS — W540XXA Bitten by dog, initial encounter: Secondary | ICD-10-CM | POA: Diagnosis not present

## 2024-05-10 MED ORDER — AMOXICILLIN-POT CLAVULANATE 875-125 MG PO TABS
1.0000 | ORAL_TABLET | Freq: Two times a day (BID) | ORAL | 0 refills | Status: AC
  Filled 2024-05-10: qty 14, 7d supply, fill #0

## 2024-06-29 ENCOUNTER — Other Ambulatory Visit (HOSPITAL_COMMUNITY): Payer: Self-pay | Admitting: Radiation Oncology

## 2024-06-29 ENCOUNTER — Other Ambulatory Visit (HOSPITAL_BASED_OUTPATIENT_CLINIC_OR_DEPARTMENT_OTHER): Payer: Self-pay

## 2024-06-29 ENCOUNTER — Other Ambulatory Visit: Payer: Self-pay | Admitting: Radiation Oncology

## 2024-06-29 ENCOUNTER — Other Ambulatory Visit (HOSPITAL_COMMUNITY): Payer: Self-pay | Admitting: Physician Assistant

## 2024-06-29 ENCOUNTER — Other Ambulatory Visit: Payer: Self-pay

## 2024-06-30 ENCOUNTER — Other Ambulatory Visit: Payer: Self-pay

## 2024-06-30 ENCOUNTER — Other Ambulatory Visit (HOSPITAL_BASED_OUTPATIENT_CLINIC_OR_DEPARTMENT_OTHER): Payer: Self-pay

## 2024-06-30 MED ORDER — SERTRALINE HCL 50 MG PO TABS
100.0000 mg | ORAL_TABLET | Freq: Every day | ORAL | 0 refills | Status: DC
  Filled 2024-06-30: qty 20, 10d supply, fill #0

## 2024-06-30 MED ORDER — ALBUTEROL SULFATE HFA 108 (90 BASE) MCG/ACT IN AERS
1.0000 | INHALATION_SPRAY | Freq: Four times a day (QID) | RESPIRATORY_TRACT | 0 refills | Status: DC | PRN
  Filled 2024-06-30: qty 6.7, 25d supply, fill #0

## 2024-08-12 ENCOUNTER — Other Ambulatory Visit: Payer: Self-pay | Admitting: Radiation Oncology

## 2024-08-12 ENCOUNTER — Other Ambulatory Visit (HOSPITAL_BASED_OUTPATIENT_CLINIC_OR_DEPARTMENT_OTHER): Payer: Self-pay

## 2024-08-12 MED ORDER — CARISOPRODOL 350 MG PO TABS
350.0000 mg | ORAL_TABLET | Freq: Two times a day (BID) | ORAL | 2 refills | Status: AC
  Filled 2024-08-12: qty 60, 30d supply, fill #0

## 2024-08-12 MED ORDER — SERTRALINE HCL 50 MG PO TABS
100.0000 mg | ORAL_TABLET | Freq: Every day | ORAL | 0 refills | Status: AC
  Filled 2024-08-12: qty 20, 10d supply, fill #0

## 2024-08-12 MED ORDER — ALBUTEROL SULFATE HFA 108 (90 BASE) MCG/ACT IN AERS
1.0000 | INHALATION_SPRAY | Freq: Four times a day (QID) | RESPIRATORY_TRACT | 0 refills | Status: AC | PRN
  Filled 2024-08-12: qty 6.7, 25d supply, fill #0

## 2024-10-04 ENCOUNTER — Other Ambulatory Visit: Payer: Self-pay

## 2024-10-04 ENCOUNTER — Other Ambulatory Visit (HOSPITAL_BASED_OUTPATIENT_CLINIC_OR_DEPARTMENT_OTHER): Payer: Self-pay

## 2024-10-04 MED ORDER — NALOXONE HCL 4 MG/0.1ML NA LIQD
4.0000 mg | NASAL | 0 refills | Status: AC
  Filled 2024-10-04: qty 2, 2d supply, fill #0

## 2024-10-15 ENCOUNTER — Other Ambulatory Visit (HOSPITAL_BASED_OUTPATIENT_CLINIC_OR_DEPARTMENT_OTHER): Payer: Self-pay
